# Patient Record
Sex: Male | Born: 1935 | Race: White | Hispanic: No | Marital: Married | Smoking: Former smoker
Health system: Southern US, Community
[De-identification: ages and names within clinical notes are randomized; demographics above are authoritative.]

## PROBLEM LIST (undated history)

## (undated) DIAGNOSIS — E039 Hypothyroidism, unspecified: Secondary | ICD-10-CM

## (undated) DIAGNOSIS — F0390 Unspecified dementia without behavioral disturbance: Secondary | ICD-10-CM

---

## 2020-08-08 ENCOUNTER — Encounter (HOSPITAL_COMMUNITY): Payer: Self-pay

## 2020-08-08 ENCOUNTER — Emergency Department (HOSPITAL_COMMUNITY): Payer: Medicare Other

## 2020-08-08 ENCOUNTER — Emergency Department (HOSPITAL_COMMUNITY)
Admission: EM | Admit: 2020-08-08 | Discharge: 2020-08-08 | Disposition: A | Discharge status: Home / Self Care | Payer: Medicare Other | Attending: Emergency Medicine | Admitting: Emergency Medicine

## 2020-08-08 ENCOUNTER — Other Ambulatory Visit: Payer: Self-pay

## 2020-08-08 DIAGNOSIS — S12101A Unspecified nondisplaced fracture of second cervical vertebra, initial encounter for closed fracture: Secondary | ICD-10-CM | POA: Diagnosis not present

## 2020-08-08 DIAGNOSIS — W01198A Fall on same level from slipping, tripping and stumbling with subsequent striking against other object, initial encounter: Secondary | ICD-10-CM | POA: Diagnosis not present

## 2020-08-08 DIAGNOSIS — S199XXA Unspecified injury of neck, initial encounter: Secondary | ICD-10-CM | POA: Diagnosis present

## 2020-08-08 DIAGNOSIS — Z20822 Contact with and (suspected) exposure to covid-19: Secondary | ICD-10-CM | POA: Insufficient documentation

## 2020-08-08 DIAGNOSIS — Y92129 Unspecified place in nursing home as the place of occurrence of the external cause: Secondary | ICD-10-CM | POA: Diagnosis not present

## 2020-08-08 DIAGNOSIS — F0390 Unspecified dementia without behavioral disturbance: Secondary | ICD-10-CM | POA: Diagnosis not present

## 2020-08-08 DIAGNOSIS — M25511 Pain in right shoulder: Secondary | ICD-10-CM | POA: Insufficient documentation

## 2020-08-08 LAB — CBC WITH DIFFERENTIAL/PLATELET
Abs Immature Granulocytes: 0.09 10*3/uL — ABNORMAL HIGH (ref 0.00–0.07)
Basophils Absolute: 0 10*3/uL (ref 0.0–0.1)
Basophils Relative: 0 %
Eosinophils Absolute: 0 10*3/uL (ref 0.0–0.5)
Eosinophils Relative: 0 %
HCT: 43.3 % (ref 39.0–52.0)
Hemoglobin: 14.2 g/dL (ref 13.0–17.0)
Immature Granulocytes: 1 %
Lymphocytes Relative: 6 %
Lymphs Abs: 0.9 10*3/uL (ref 0.7–4.0)
MCH: 31.1 pg (ref 26.0–34.0)
MCHC: 32.8 g/dL (ref 30.0–36.0)
MCV: 95 fL (ref 80.0–100.0)
Monocytes Absolute: 1.3 10*3/uL — ABNORMAL HIGH (ref 0.1–1.0)
Monocytes Relative: 8 %
Neutro Abs: 13.7 10*3/uL — ABNORMAL HIGH (ref 1.7–7.7)
Neutrophils Relative %: 85 %
Platelets: 295 10*3/uL (ref 150–400)
RBC: 4.56 MIL/uL (ref 4.22–5.81)
RDW: 13 % (ref 11.5–15.5)
WBC: 16 10*3/uL — ABNORMAL HIGH (ref 4.0–10.5)
nRBC: 0 % (ref 0.0–0.2)

## 2020-08-08 LAB — BASIC METABOLIC PANEL
Anion gap: 11 (ref 5–15)
BUN: 19 mg/dL (ref 8–23)
CO2: 25 mmol/L (ref 22–32)
Calcium: 9.6 mg/dL (ref 8.9–10.3)
Chloride: 105 mmol/L (ref 98–111)
Creatinine, Ser: 0.99 mg/dL (ref 0.61–1.24)
GFR, Estimated: >60 mL/min (ref >60)
Glucose, Bld: 102 mg/dL — ABNORMAL HIGH (ref 70–99)
Potassium: 3.8 mmol/L (ref 3.5–5.1)
Sodium: 141 mmol/L (ref 135–145)

## 2020-08-08 LAB — RESP PANEL BY RT-PCR (FLU A&B, COVID) ARPGX2
Influenza A by PCR: NEGATIVE
Influenza B by PCR: NEGATIVE
SARS Coronavirus 2 by RT PCR: NEGATIVE

## 2020-08-08 NOTE — ED Provider Notes (Signed)
[facility]-EMERGENCY DEPT Provider Note   CSN: [id number] Arrival date & time: 08/08/20  1038     History Chief Complaint  Patient presents with   Jason Valdez is a 87 y.o. male presents today from nursing facility following a witnessed fall, mechanical slip falling backwards striking the back of his head on the ground.  Patient reporting posterior headache and neck pain, mild aching constant worsens with palpation improves with rest.  Patient does have a history of dementia.  Facility reports no blood thinner use.  Additionally patient reports right shoulder pain gradually improving since arrival to this facility, aching, constant, worsens with movement.  Cervical collar placed by EMS PTA.  Denies recent illness additional concerns today.  Level 5 caveat dementia. HPI     History reviewed. No pertinent past medical history.  There are no problems to display for this patient.        No family history on file.     Home Medications Prior to Admission medications   Not on File    Allergies    Patient has no allergy information on record.  Review of Systems   Review of Systems  Unable to perform ROS: Dementia   Physical Exam Updated Vital Signs BP (!) 144/79   Pulse 72   Temp 97.9 F (36.6 C) (Oral)   Resp (!) 196   SpO2 100%   Physical Exam Constitutional:      General: He is not in acute distress.    Appearance: Normal appearance. He is well-developed. He is not ill-appearing or diaphoretic.  HENT:     Head: Normocephalic and atraumatic.  Eyes:     General: Vision grossly intact. Gaze aligned appropriately.     Pupils: Pupils are equal, round, and reactive to light.  Neck:     Trachea: Trachea and phonation normal.     Comments: Cervical collar in place.  No anterior neck tenderness to palpation. Cardiovascular:     Pulses:          Dorsalis pedis pulses are 2+ on the right side and 2+ on the left side.   Pulmonary:     Effort: Pulmonary effort is normal. No respiratory distress.  Abdominal:     General: There is no distension.     Palpations: Abdomen is soft.     Tenderness: There is no abdominal tenderness. There is no guarding or rebound.  Musculoskeletal:        General: Normal range of motion.     Comments: No midline T/L spinal tenderness to palpation, no paraspinal muscle tenderness, no deformity, crepitus, or step-off noted. No sign of injury to the neck or back.  Pelvis stable to compression bilaterally without pain.  Full range of motion of the bilateral hips, knees, ankles and feet without pain.  Full range of motion strength with the left upper extremity without pain. - Right shoulder: Diffuse tenderness palpation, no bony tenderness or deformity.  Full range of motion and strength for age with movements of the right shoulder.  No pain with movement at the right elbow hand or wrist.  Capillary refill and sensation intact.  Radial pulses intact.  Compartments are soft.  Feet:     Right foot:     Protective Sensation: 3 sites tested.  3 sites sensed.     Left foot:     Protective Sensation: 3 sites tested.  3 sites sensed.  Skin:    General: Skin is warm  and dry.  Neurological:     Mental Status: He is alert.     GCS: GCS eye subscore is 4. GCS verbal subscore is 5. GCS motor subscore is 6.     Comments: Speech is clear and goal oriented, follows commands Major Cranial nerves without deficit, no facial droop Moves extremities without ataxia, coordination intact 5/5 strength with bilateral grip and bilateral dorsi/plantar flexion  Psychiatric:        Behavior: Behavior normal.    ED Results / Procedures / Treatments   Labs (all labs ordered are listed, but only abnormal results are displayed) Labs Reviewed  CBC WITH DIFFERENTIAL/PLATELET - Abnormal; Notable for the following components:      Result Value   WBC 16.0 (*)    Neutro Abs 13.7 (*)    Monocytes Absolute 1.3  (*)    Abs Immature Granulocytes 0.09 (*)    All other components within normal limits  BASIC METABOLIC PANEL - Abnormal; Notable for the following components:   Glucose, Bld 102 (*)    All other components within normal limits  RESP PANEL BY RT-PCR (FLU A&B, COVID) [id number]    EKG None  Radiology DG Shoulder Right  Result Date: 08/08/2020 CLINICAL DATA:  Pain after a fall. EXAM: RIGHT SHOULDER - 2+ VIEW COMPARISON:  None. FINDINGS: No evidence for an acute fracture. No subluxation or dislocation. No shoulder separation. Degenerative changes noted at the acromioclavicular joint. IMPRESSION: Negative. Electronically Signed   By: Kennith Center M.D.   On: 08/08/2020 12:59   CT Head Wo Contrast  Result Date: 08/08/2020 CLINICAL DATA:  Head trauma EXAM: CT HEAD WITHOUT CONTRAST CT CERVICAL SPINE WITHOUT CONTRAST TECHNIQUE: Multidetector CT imaging of the head and cervical spine was performed following the standard protocol without intravenous contrast. Multiplanar CT image reconstructions of the cervical spine were also generated. COMPARISON:  None FINDINGS: CT HEAD FINDINGS Brain: Generalized atrophy. Normal ventricular morphology. No midline shift or mass effect. Small vessel chronic ischemic changes of deep cerebral white matter. No intracranial hemorrhage, mass lesion, evidence of acute infarction, or extra-axial fluid collection. Vascular: No hyperdense vessels Skull: Demineralized but intact Sinuses/Orbits: Mucosal retention cyst RIGHT maxillary sinus. Remaining paranasal sinuses and mastoid air cells clear Other: N/A CT CERVICAL SPINE FINDINGS Alignment: Minimal anterolisthesis at C4-C5 due to degenerative disc and facet disease. Remaining alignments normal Skull base and vertebrae: Diffuse osseous demineralization. Skull base intact. Type 3 odontoid fracture identified extending into the RIGHT body of C2 as well as intra-articular at the RIGHT C1-C2 articulation. Less extension into LEFT C2 body  but extension of fracture into the LEFT pedicle of C2 is present. Fracture minimally displaced. Scattered facet degenerative changes. Disc space narrowing throughout cervical spine with endplate spurs greatest at C5-C6 and C7-T1. No additional fracture or subluxation. Soft tissues and spinal canal: Prevertebral soft tissues normal thickness. Atherosclerotic calcifications at carotid bifurcations. Disc levels:  No specific abnormalities Upper chest: Lung apices clear. Other: N/A IMPRESSION: Atrophy with small vessel chronic ischemic changes of deep cerebral white matter. No acute intracranial abnormalities. Multilevel degenerative disc and facet disease changes of the cervical spine as above. Minimally displaced type 3 odontoid fracture extending into the RIGHT body of C2 as well as intra-articular at the RIGHT C1-C2 articulation, with less extension into LEFT C2 body but with fracture into into the LEFT pedicle of C2. Critical Value/emergent results were called by telephone at the time of interpretation on 08/08/2020 at 1146 hrs to provider Dr. Bernette Mayers, who verbally  acknowledged these results. Electronically Signed   By: Ulyses SouthwardMark  Boles M.D.   On: 08/08/2020 11:55   CT Cervical Spine Wo Contrast  Result Date: 08/08/2020 CLINICAL DATA:  Head trauma EXAM: CT HEAD WITHOUT CONTRAST CT CERVICAL SPINE WITHOUT CONTRAST TECHNIQUE: Multidetector CT imaging of the head and cervical spine was performed following the standard protocol without intravenous contrast. Multiplanar CT image reconstructions of the cervical spine were also generated. COMPARISON:  None FINDINGS: CT HEAD FINDINGS Brain: Generalized atrophy. Normal ventricular morphology. No midline shift or mass effect. Small vessel chronic ischemic changes of deep cerebral white matter. No intracranial hemorrhage, mass lesion, evidence of acute infarction, or extra-axial fluid collection. Vascular: No hyperdense vessels Skull: Demineralized but intact Sinuses/Orbits:  Mucosal retention cyst RIGHT maxillary sinus. Remaining paranasal sinuses and mastoid air cells clear Other: N/A CT CERVICAL SPINE FINDINGS Alignment: Minimal anterolisthesis at C4-C5 due to degenerative disc and facet disease. Remaining alignments normal Skull base and vertebrae: Diffuse osseous demineralization. Skull base intact. Type 3 odontoid fracture identified extending into the RIGHT body of C2 as well as intra-articular at the RIGHT C1-C2 articulation. Less extension into LEFT C2 body but extension of fracture into the LEFT pedicle of C2 is present. Fracture minimally displaced. Scattered facet degenerative changes. Disc space narrowing throughout cervical spine with endplate spurs greatest at C5-C6 and C7-T1. No additional fracture or subluxation. Soft tissues and spinal canal: Prevertebral soft tissues normal thickness. Atherosclerotic calcifications at carotid bifurcations. Disc levels:  No specific abnormalities Upper chest: Lung apices clear. Other: N/A IMPRESSION: Atrophy with small vessel chronic ischemic changes of deep cerebral white matter. No acute intracranial abnormalities. Multilevel degenerative disc and facet disease changes of the cervical spine as above. Minimally displaced type 3 odontoid fracture extending into the RIGHT body of C2 as well as intra-articular at the RIGHT C1-C2 articulation, with less extension into LEFT C2 body but with fracture into into the LEFT pedicle of C2. Critical Value/emergent results were called by telephone at the time of interpretation on 08/08/2020 at 1146 hrs to provider Dr. Bernette MayersSheldon, who verbally acknowledged these results. Electronically Signed   By: Ulyses SouthwardMark  Boles M.D.   On: 08/08/2020 11:55   DG Pelvis Portable  Result Date: 08/08/2020 CLINICAL DATA:  Fall EXAM: PORTABLE PELVIS 1-2 VIEWS COMPARISON:  None. FINDINGS: Mild symmetric degenerative changes in the hips and in the lower lumbar spine. SI joints are symmetric. No acute bony abnormality.  Specifically, no fracture, subluxation, or dislocation. IMPRESSION: No acute bony abnormality. Electronically Signed   By: Charlett NoseKevin  Dover M.D.   On: 08/08/2020 11:58   DG Chest Portable 1 View  Result Date: 08/08/2020 CLINICAL DATA:  Witnessed fall, altered mental status EXAM: PORTABLE CHEST 1 VIEW COMPARISON:  08/08/2020 FINDINGS: The heart size and mediastinal contours are within normal limits. Both lungs are clear. The visualized skeletal structures are unremarkable. IMPRESSION: No active disease. Electronically Signed   By: Charlett NoseKevin  Dover M.D.   On: 08/08/2020 11:59    Procedures Procedures   Medications Ordered in ED Medications - No data to display  ED Course  I have reviewed the triage vital signs and the nursing notes.  Pertinent labs & imaging results that were available during my care of the patient were reviewed by me and considered in my medical decision making (see chart for details).     MDM Rules/Calculators/A&P                         Additional  history obtained from: Nursing notes from this visit. Family, patient's son at bedside. No prior medical records to review. -------------------------------------------- I ordered, reviewed and interpreted labs which include: COVID/influenza panel negative. CBC shows leukocytosis of 16.0, suspect this is secondary to fall and injury today, low suspicion for infection at this time, hemoglobin within normal limits, platelet count within normal limits. BMP shows no emergent electrolyte derangement, AKI or gap.  CT head/Cspine:  IMPRESSION:  Atrophy with small vessel chronic ischemic changes of deep cerebral  white matter.     No acute intracranial abnormalities.     Multilevel degenerative disc and facet disease changes of the  cervical spine as above.     Minimally displaced type 3 odontoid fracture extending into the  RIGHT body of C2 as well as intra-articular at the RIGHT C1-C2  articulation, with less extension into  LEFT C2 body but with  fracture into into the LEFT pedicle of C2.     Critical Value/emergent results were called by telephone at the time  of interpretation on 08/08/2020 at 1146 hrs to provider Dr. Bernette Mayers,  who verbally acknowledged these results.   DG Chest:  IMPRESSION:  No active disease.   DG Pelvis:  IMPRESSION:  No acute bony abnormality.   DG Right Shoulder:  IMPRESSION:  Negative.  - 12:16 PM: Consult with neurosurgery, Dr. Lovell Sheehan.  Dr. Lovell Sheehan recommends placing patient in Aspen collar and have him follow-up in their office in 2 weeks. - Patient reassessed resting comfortably no acute distress reports he is feeling well.  No weakness of the extremities.  No neurologic complaints.  Patient was placed in an Aspen cervical collar, patient and his son at bedside were educated on keeping the collar in place to avoid further injury.  Patient was also given a sling for his right shoulder.  Suspect muscular shoulder pain due to fall, no evidence for fracture dislocation at this time additionally patient is neurovascular intact distally.  Referral given to on-call orthopedist, Delbert Harness for follow-up regarding right shoulder pain.  No additional work-up is indicated this time, patient has no pain to the chest, abdomen, pelvis, back, lower extremities or left upper extremity.  I discussed in detail plan of care with the patient's son and the patient and they both state understanding.  At this time there does not appear to be any evidence of an acute emergency medical condition and the patient appears stable for discharge with appropriate outpatient follow up. Diagnosis was discussed with patient who verbalizes understanding of care plan and is agreeable to discharge. I have discussed return precautions with patient and son who verbalizes understanding. Patient encouraged to follow-up with their PCP, orthopedist and neurosurgery.  All questions answered.  Patient's case discussed with  Dr. Bernette Mayers who agrees with plan to discharge with follow-up.   Note: Portions of this report may have been transcribed using voice recognition software. Every effort was made to ensure accuracy; however, inadvertent computerized transcription errors may still be present.  Final Clinical Impression(s) / ED Diagnoses Final diagnoses:  Closed nondisplaced fracture of second cervical vertebra, unspecified fracture morphology, initial encounter [place] [facility])  Acute pain of right shoulder    Rx / DC Orders ED Discharge Orders     None        Elizabeth Palau 08/08/20 1550    Pollyann Savoy, MD 08/09/20 [phone]

## 2020-08-08 NOTE — Progress Notes (Signed)
Orthopedic Tech Progress Note Patient Details:  Jason Valdez [birth year] [id number]  Ortho Devices Type of Ortho Device: Shoulder immobilizer   Post Interventions Patient Tolerated: Well Instructions Provided: Adjustment of device, Care of device  Saul Fordyce 08/08/2020, 3:01 PM

## 2020-08-08 NOTE — Discharge Instructions (Signed)
At this time there does not appear to be the presence of an emergent medical condition, however there is always the potential for conditions to change. Please read and follow the below instructions.  Please return to the Emergency Department immediately for any new or worsening symptoms. Please be sure to follow up with your Primary Care Provider within one week regarding your visit today; please call their office to schedule an appointment even if you are feeling better for a follow-up visit. Please keep your cervical collar on at all times to protect the bones of your neck.  Please call [facility] today to schedule a follow-up appointment, Dr. Lovell Sheehan would like to see you in 2 weeks in his office for further treatment. You may use the sling to inject your right shoulder from further injury.  You may call the orthopedic specialist at [facility] for a follow-up regarding your right shoulder pain after your fall.  Go to the nearest Emergency Department immediately if: You have fever or chills You have neck pain that gets worse. You develop difficulties swallowing or breathing. You develop swelling in your neck. Your arm, hand, or fingers: Tingle. Are numb. Are swollen. Are painful. Turn white or blue. You have any of the following problems in your arms or legs or both: Numbness. Weakness. Burning pain. Movement problems. You are unable to control when you urinate or have a bowel movement (incontinence). You have problems with coordination or difficulty walking. You have any new/concerning or worsening of symptoms  Please read the additional information packets attached to your discharge summary.  Do not take your medicine if  develop an itchy rash, swelling in your mouth or lips, or difficulty breathing; call 911 and seek immediate emergency medical attention if this occurs.  You may review your lab tests and imaging results in their entirety on your  MyChart account.  Please discuss all results of fully with your primary care provider and other specialist at your follow-up visit.  Note: Portions of this text may have been transcribed using voice recognition software. Every effort was made to ensure accuracy; however, inadvertent computerized transcription errors may still be present.

## 2020-08-08 NOTE — Consult Note (Signed)
I was contacted by Apolinar Junes, PA-C regarding this patient.  By report he is a demented 87 year old who took a fall.  He was brought into [facility] via EMS.  He is CT scan of the head was unremarkable except for atrophy.  His cervical CT demonstrated a cervical fracture.  There is no reported weakness.  I reviewed the patient's cervical CT performed at [facility] today.  He has a type III odontoid fracture which extends into the right C1-2 facet and pedicle.  The fracture is fairly well aligned.  This fracture will likely heal adequately in the collar.  I have recommended the patient be placed in a hard cervical collar and follow-up with me in the office in a week or 2.

## 2020-08-08 NOTE — ED Triage Notes (Signed)
EMS reports from [place] at [place], witnessed mechanical slip and fall, strike to back of head. No obvious injuries, no LOC or blood thinners. Pt c/o head and neck pain. Pt hx of Dementia, facility states at baseline.  BP 180/88 HR 64 RR 16 Sp02 100 RA CBG 108

## 2020-08-08 NOTE — Progress Notes (Signed)
Orthopedic Tech Progress Note Patient Details:  Jason Valdez January 18, 1929 [id number]  Patient ID: Jason Valdez, male   DOB: May 13, 1928, 87 y.o.   MRN: [id number]  Jason Valdez 08/08/2020, 12:31 PMCalled and routed Aspen collar to [facility]

## 2020-08-08 NOTE — ED Notes (Signed)
Ortho informed of Aspen collar order and will acquire and apply.Marland Kitchen

## 2020-08-08 NOTE — ED Notes (Signed)
Pt is able to walk and he can walked well.

## 2020-10-13 ENCOUNTER — Emergency Department (HOSPITAL_COMMUNITY): Payer: Medicare Other

## 2020-10-13 ENCOUNTER — Encounter (HOSPITAL_COMMUNITY): Payer: Self-pay | Admitting: Emergency Medicine

## 2020-10-13 ENCOUNTER — Other Ambulatory Visit: Payer: Self-pay

## 2020-10-13 ENCOUNTER — Emergency Department (HOSPITAL_COMMUNITY)
Admission: EM | Admit: 2020-10-13 | Discharge: 2020-10-14 | Disposition: A | Discharge status: Home / Self Care | Payer: Medicare Other | Attending: Emergency Medicine | Admitting: Emergency Medicine

## 2020-10-13 DIAGNOSIS — R059 Cough, unspecified: Secondary | ICD-10-CM | POA: Diagnosis present

## 2020-10-13 DIAGNOSIS — U071 COVID-19: Secondary | ICD-10-CM | POA: Diagnosis not present

## 2020-10-13 DIAGNOSIS — D72829 Elevated white blood cell count, unspecified: Secondary | ICD-10-CM | POA: Diagnosis not present

## 2020-10-13 DIAGNOSIS — F0390 Unspecified dementia without behavioral disturbance: Secondary | ICD-10-CM | POA: Diagnosis not present

## 2020-10-13 DIAGNOSIS — Z87891 Personal history of nicotine dependence: Secondary | ICD-10-CM | POA: Diagnosis not present

## 2020-10-13 LAB — COMPREHENSIVE METABOLIC PANEL
ALT: 9 U/L (ref 0–44)
AST: 16 U/L (ref 15–41)
Albumin: 4.3 g/dL (ref 3.5–5.0)
Alkaline Phosphatase: 64 U/L (ref 38–126)
Anion gap: 8 (ref 5–15)
BUN: 24 mg/dL — ABNORMAL HIGH (ref 8–23)
CO2: 27 mmol/L (ref 22–32)
Calcium: 9.1 mg/dL (ref 8.9–10.3)
Chloride: 103 mmol/L (ref 98–111)
Creatinine, Ser: 1.13 mg/dL (ref 0.61–1.24)
GFR, Estimated: >60 mL/min (ref >60)
Glucose, Bld: 103 mg/dL — ABNORMAL HIGH (ref 70–99)
Potassium: 4.1 mmol/L (ref 3.5–5.1)
Sodium: 138 mmol/L (ref 135–145)
Total Bilirubin: 0.6 mg/dL (ref 0.3–1.2)
Total Protein: 7.5 g/dL (ref 6.5–8.1)

## 2020-10-13 LAB — CBC WITH DIFFERENTIAL/PLATELET
Abs Immature Granulocytes: 0.03 10*3/uL (ref 0.00–0.07)
Basophils Absolute: 0.1 10*3/uL (ref 0.0–0.1)
Basophils Relative: 1 %
Eosinophils Absolute: 0.2 10*3/uL (ref 0.0–0.5)
Eosinophils Relative: 1 %
HCT: 43 % (ref 39.0–52.0)
Hemoglobin: 13.7 g/dL (ref 13.0–17.0)
Immature Granulocytes: 0 %
Lymphocytes Relative: 12 %
Lymphs Abs: 1.2 10*3/uL (ref 0.7–4.0)
MCH: 31.5 pg (ref 26.0–34.0)
MCHC: 31.9 g/dL (ref 30.0–36.0)
MCV: 98.9 fL (ref 80.0–100.0)
Monocytes Absolute: 1.6 10*3/uL — ABNORMAL HIGH (ref 0.1–1.0)
Monocytes Relative: 15 %
Neutro Abs: 7.7 10*3/uL (ref 1.7–7.7)
Neutrophils Relative %: 71 %
Platelets: 277 10*3/uL (ref 150–400)
RBC: 4.35 MIL/uL (ref 4.22–5.81)
RDW: 13.2 % (ref 11.5–15.5)
WBC: 10.7 10*3/uL — ABNORMAL HIGH (ref 4.0–10.5)
nRBC: 0 % (ref 0.0–0.2)

## 2020-10-13 MED ORDER — BENZONATATE 100 MG PO CAPS: 100.0000 mg | ORAL_CAPSULE | Freq: Three times a day (TID) | ORAL | 0 refills | Status: DC

## 2020-10-13 MED ORDER — MOLNUPIRAVIR EUA 200MG CAPSULE: 4.0000 | ORAL_CAPSULE | Freq: Two times a day (BID) | ORAL | 0 refills | Status: AC

## 2020-10-13 NOTE — ED Notes (Signed)
Per [facility] at [facility] should bring him down to the side door because they cannot let him in through the front door.

## 2020-10-13 NOTE — ED Provider Notes (Signed)
[facility]-EMERGENCY DEPT Provider Note   CSN: [id number] Arrival date & time: 10/13/20  1915    History Cough  Jason Valdez is a 87 y.o. male with past medical history significant for dementia who presents for evaluation of cough, COVID-positive.  Diagnosed yesterday.  Symptom onset day prior to that>>2 days PTA.  States his cough is nonproductive.  Patient has no complaints, denies headache, emesis, chest pain, shortness of breath, abdominal pain, weakness.  Does state occasionally he gets a scratchy throat however has been able to eat and drink without difficulty.   [name] at [phone] updated on plan. Apparently Dx with COVID yesterday. Sx onset day before. Apparently facility concerned with patient cough.  Spoke with [name] at [facility]. Patient with cough, congestion. States patient wanted meds for sx however no doc available at facility currently. At baseline mentation, confused. No recent falls.   Level 5 caveat-dementia  HPI     History reviewed. No pertinent past medical history.  There are no problems to display for this patient.   History reviewed. No pertinent surgical history.    No family history on file.  Social History   Tobacco Use   Smoking status: Former    Types: Cigarettes   Smokeless tobacco: Never    Home Medications Prior to Admission medications   Medication Sig Start Date End Date Taking? Authorizing Provider  benzonatate (TESSALON) 100 MG capsule Take 1 capsule (100 mg total) by mouth every 8 (eight) hours. 10/13/20  Yes Donnette Macmullen A, PA-C  molnupiravir EUA (LAGEVRIO) 200 mg CAPS capsule Take 4 capsules (800 mg total) by mouth 2 (two) times daily for 5 days. 10/13/20 10/18/20 Yes Donnavan Covault A, PA-C    Allergies    Patient has no allergy information on record.  Review of Systems   Review of Systems  Unable to perform ROS: Dementia  Constitutional: Negative.   HENT: Negative.     Respiratory:  Positive for cough. Negative for apnea, choking, chest tightness, shortness of breath, wheezing and stridor.   Cardiovascular: Negative.   Gastrointestinal: Negative.   Genitourinary: Negative.   Musculoskeletal: Negative.   Skin: Negative.   Neurological: Negative.   All other systems reviewed and are negative.  Physical Exam Updated Vital Signs BP 136/77   Pulse (!) 58   Temp 98 F (36.7 C) (Oral)   Resp 20   Ht 6\' 1"  (1.854 m)   Wt 81.6 kg   SpO2 98%   BMI 23.75 kg/m   Physical Exam Vitals and nursing note reviewed.  Constitutional:      General: He is not in acute distress.    Appearance: He is well-developed. He is not ill-appearing, toxic-appearing or diaphoretic.  HENT:     Head: Normocephalic and atraumatic.     Nose: Nose normal.     Mouth/Throat:     Mouth: Mucous membranes are moist.     Comments: Posterior oropharynx clear.  Uvula midline.  No evidence of PTA or RPA.  No pooling of secretions Eyes:     Pupils: Pupils are equal, round, and reactive to light.  Cardiovascular:     Rate and Rhythm: Normal rate and regular rhythm.     Pulses: Normal pulses.          Radial pulses are 2+ on the right side and 2+ on the left side.       Dorsalis pedis pulses are 2+ on the right side and 2+ on the left  side.     Heart sounds: Normal heart sounds.  Pulmonary:     Effort: Pulmonary effort is normal. No respiratory distress.     Breath sounds: Normal breath sounds.     Comments: Clear bilaterally, speaks in full sentences without difficulty Abdominal:     General: Bowel sounds are normal. There is no distension.     Palpations: Abdomen is soft.     Tenderness: There is no abdominal tenderness. There is no right CVA tenderness, left CVA tenderness, guarding or rebound.  Musculoskeletal:        General: Normal range of motion.     Cervical back: Normal range of motion and neck supple.     Comments: No bony tenderness.  Moves all 4 extremities.   Compartments soft  Skin:    General: Skin is warm and dry.     Capillary Refill: Capillary refill takes less than 2 seconds.     Comments: No rash  Neurological:     General: No focal deficit present.     Mental Status: He is alert. Mental status is at baseline.     Comments: Aler to person, DOB, not time Follow commands CN 2-12 grossly intact Ambulatory without ataxia Equal grip Intact sensation    ED Results / Procedures / Treatments   Labs (all labs ordered are listed, but only abnormal results are displayed) Labs Reviewed  CBC WITH DIFFERENTIAL/PLATELET - Abnormal; Notable for the following components:      Result Value   WBC 10.7 (*)    Monocytes Absolute 1.6 (*)    All other components within normal limits  COMPREHENSIVE METABOLIC PANEL - Abnormal; Notable for the following components:   Glucose, Bld 103 (*)    BUN 24 (*)    All other components within normal limits    EKG EKG Interpretation  Date/Time:  Thursday October 13 2020 20:32:02 EDT Ventricular Rate:  61 PR Interval:  180 QRS Duration: 111 QT Interval:  459 QTC Calculation: 463 R Axis:   3 Text Interpretation: Ectopic atrial rhythm Atrial premature complexes Left atrial enlargement Probable inferior infarct, old Confirmed by Gloris Manchester [phone]) on 10/13/2020 8:44:28 PM  Radiology DG Chest Portable 1 View  Result Date: 10/13/2020 CLINICAL DATA:  Cough, EXAM: PORTABLE CHEST 1 VIEW COMPARISON:  Chest x-ray 08/08/2020 FINDINGS: The heart and mediastinal contours are unchanged. Aortic calcification. No focal consolidation. No pulmonary edema. No pleural effusion. No pneumothorax. No acute osseous abnormality. IMPRESSION: No acute cardiopulmonary abnormality Electronically Signed   By: Tish Frederickson M.D.   On: 10/13/2020 20:19    Procedures Procedures   Medications Ordered in ED Medications - No data to display  ED Course  I have reviewed the triage vital signs and the nursing notes.  Pertinent labs  & imaging results that were available during my care of the patient were reviewed by me and considered in my medical decision making (see chart for details).  87 year old recently diagnosed with COVID presents for evaluation of cough.  He is afebrile, nonseptic, not ill-appearing.  His heart and lungs are clear.  Abdomen soft, nontender.  Normal musculoskeletal exam, nonfocal neuro exam without deficits.  Has a known history of dementia however patient appears pleasant, no complaints.  Posterior oropharynx clear, no evidence of PTA or RPA, no pooling of secretions.  Plan check labs, chest x-ray reassess  Labs and imaging personally reviewed and interpreted:  Chest x-ray without any evidence of infiltrates, cardiomegaly, pulmonary edema, pneumothorax CBC with mild leukocytosis  at 10.7, hemoglobin 13.7 EKG without ischemic changes CMP without acute abnormality  Patient reassessed. Discussed plan with family via phone. Will dc home with sx management for his COVID. Ambulatory here without hypoxia.  The patient has been appropriately medically screened and/or stabilized in the ED. I have low suspicion for any other emergent medical condition which would require further screening, evaluation or treatment in the ED or require inpatient management.  Patient is hemodynamically stable and in no acute distress.  Patient able to ambulate in department prior to ED.  Evaluation does not show acute pathology that would require ongoing or additional emergent interventions while in the emergency department or further inpatient treatment.  I have discussed the diagnosis with the patient and answered all questions.  Pain is been managed while in the emergency department and patient has no further complaints prior to discharge.  Patient is comfortable with plan discussed in room and is stable for discharge at this time.  I have discussed strict return precautions for returning to the emergency department.  Patient was  encouraged to follow-up with PCP/specialist refer to at discharge.     MDM Rules/Calculators/A&P                          Lattie Cervi was evaluated in Emergency Department on 10/13/2020 for the symptoms described in the history of present illness. He was evaluated in the context of the global COVID-19 pandemic, which necessitated consideration that the patient might be at risk for infection with the SARS-CoV-2 virus that causes COVID-19. Institutional protocols and algorithms that pertain to the evaluation of patients at risk for COVID-19 are in a state of rapid change based on information released by regulatory bodies including the CDC and federal and state organizations. These policies and algorithms were followed during the patient's care in the ED.  Final Clinical Impression(s) / ED Diagnoses Final diagnoses:  COVID    Rx / DC Orders ED Discharge Orders          Ordered    molnupiravir EUA (LAGEVRIO) 200 mg CAPS capsule  2 times daily        10/13/20 2207    benzonatate (TESSALON) 100 MG capsule  Every 8 hours        10/13/20 2207             Hermenia Fritcher A, PA-C 10/13/20 2329    Gloris Manchester, MD 10/14/20 1529

## 2020-10-13 NOTE — Discharge Instructions (Signed)
Take the medications as prescribed  Return for new or worsening symptoms 

## 2020-10-13 NOTE — ED Notes (Signed)
Pt given crackers and orange juice for PO challenge. Pt able to eat and drink with no distress

## 2020-10-13 NOTE — ED Triage Notes (Signed)
Pt arrived via EMS from [place] [facility] [facility]. Pt is covid positive and has had a cough and a sore throat. Pt was diagnosed with covid yesterday. Pt has hx of dementia. Pt has hx of sinus arrhythmia.

## 2020-10-13 NOTE — ED Notes (Signed)
Patients son called for an update : Jason Valdez [phone]

## 2020-11-03 ENCOUNTER — Non-Acute Institutional Stay: Payer: Medicare Other | Admitting: *Deleted

## 2020-11-03 ENCOUNTER — Other Ambulatory Visit: Payer: Self-pay

## 2020-11-03 ENCOUNTER — Non-Acute Institutional Stay: Payer: Medicare Other

## 2020-11-03 DIAGNOSIS — Z515 Encounter for palliative care: Secondary | ICD-10-CM

## 2020-11-09 ENCOUNTER — Other Ambulatory Visit: Payer: Self-pay | Admitting: Orthopedic Surgery

## 2020-11-09 DIAGNOSIS — S129XXA Fracture of neck, unspecified, initial encounter: Secondary | ICD-10-CM

## 2020-11-09 NOTE — Progress Notes (Signed)
COMMUNITY PALLIATIVE CARE SW NOTE  PATIENT NAME: Jason Valdez DOB: 1928-03-17 MRN: [id number]  PRIMARY CARE PROVIDER: System, Provider Not In  RESPONSIBLE PARTY:  Acct ID - Guarantor Home Phone Work Phone Relationship Acct Type  [id number] - Dilday,A* [phone]  Self P/F     [address], [place], [place] [address]     PLAN OF CARE and INTERVENTIONS:             GOALS OF CARE/ ADVANCE CARE PLANNING:  Goal is for patient to remain in the facility with his wife. Patient is a DNR. SOCIAL/EMOTIONAL/SPIRITUAL ASSESSMENT/ INTERVENTIONS:  SW obtained verbal consents to SW and RN-M. Dimas Aguas completed an initial visit with patient at the facility. Patient was present in the dayroom, watching TV with his wife. He was awake and alert. He appeared to be alert and oriented to self and situation. He was calm and cordial with the team. He denied that he was having any pain. Patient is thin in appearance, but he ambulates independently. Patient engaged briefly in life review, but stopped to assist his wife back to their room. SW consulted with the med tech-Lal regarding patient status. She report that patient is independent for all ADL's. His appetite is good. Patient's current weight is 156 lbs. He is very attentive to his wife. Patient is continent of bowel and bladder. Patient has mild cognitive deficits, but is able to engage. SW provided assessment of patient needs and comfort, supportive presence, and consult with staff.  PATIENT/CAREGIVER EDUCATION/ COPING:  Patient appears to be coping well.  PERSONAL EMERGENCY PLAN:  Per facility protocol.  COMMUNITY RESOURCES COORDINATION/ HEALTH CARE NAVIGATION:  None.  FINANCIAL/LEGAL CONCERNS/INTERVENTIONS:  None      SOCIAL HX:  Social History   Tobacco Use   Smoking status: Former    Types: Cigarettes   Smokeless tobacco: Never  Substance Use Topics   Alcohol use: Not on file    CODE STATUS: DNR. ADVANCED DIRECTIVES: No MOST  FORM COMPLETE:  No HOSPICE EDUCATION PROVIDED: No  PPS:  Patient is alert and oriented to self. He ambulates independently. His appetite is good.   Duration of visit and documentation: 45 minutes.  [address] [place], [place]

## 2021-01-02 NOTE — Progress Notes (Signed)
[facility] RN NOTE  PATIENT NAME: Jason Valdez DOB: Jan 20, 1929 MRN: [id number]  PRIMARY CARE PROVIDER: System, Provider Not In  RESPONSIBLE PARTY:  Acct ID - Guarantor Home Phone Work Phone Relationship Acct Type  [id number] - Petkus,A* [phone]  Self P/F     [address], [place], [place] [address]   Covid-19 Pre-screening Negative  PLAN OF CARE and INTERVENTION:  ADVANCE CARE PLANNING/GOALS OF CARE: Goal is for patient to remain on the memory care unit at [facility] facility. PATIENT/CAREGIVER EDUCATION: Symptom management DISEASE STATUS: Joint initial visit made with LCSW, M. Lonon. Upon arrival patient and his wife are sitting in the living room watching television on the unit. He is sitting on the couch and his wife in the wheelchair in front of him. He is able to answer questions and make his needs known. He does have some cognitive issues. His mood is pleasant and he laughed and smiled at the comedy playing on the tv. His wife wanted to be taken to her room, so he ended the conversation abruptly to assist her. He is ambulatory without the use of assistive devices. He is very attentive to his wife and her needs. Spoke with med tech, Harvie Junior. She advised that patient is has a good appetite and currently weighs 156 lbs. No dysphagia. He is continent of both bowel and bladder. He was sent to the ED on 10/13/20 due to a persistent cough. He was diagnosed with Covid-19 the previous day. His chest x-ray was negative and was hemodynamically stable. He was treated and released back to the facility. No coughing noted during visit today. Consent given prior to visit for palliative care.   HISTORY OF PRESENT ILLNESS:  This is a 87 yo patient with a diagnosis of dementia. Palliative care team was asked to follow patient for additional support, goals of care and complex decision making.    CODE STATUS: DNR ADVANCED DIRECTIVES: No MOST FORM: no PPS:  50%   (Duration of visit and documentation 30 minutes)   Candiss Norse, RN BSN

## 2021-01-17 ENCOUNTER — Other Ambulatory Visit: Payer: Self-pay

## 2021-01-17 ENCOUNTER — Non-Acute Institutional Stay: Payer: Medicare Other | Admitting: *Deleted

## 2021-01-17 ENCOUNTER — Non-Acute Institutional Stay: Payer: Medicare Other

## 2021-01-17 DIAGNOSIS — Z515 Encounter for palliative care: Secondary | ICD-10-CM

## 2021-01-17 NOTE — Progress Notes (Signed)
[facility] PALL[facility] C[facility] RN NOTE  PATIENT NAME: Jason Valdez DOB: 1928/02/04 MRN: [id number]  PRIMARY CARE PROVIDER: System, Provider Not In  RESPONSIBLE PARTY:  Acct ID - Guarantor Home Phone Work Phone Relationship Acct Type  [id number] - Tremaine,A* [phone]  Self P/F     [place], [place],  [address]   Covid-19 Pre-screening Negative  PLAN OF CARE and INTERVENTION:  ADVANCE CARE PLANNING/GOALS OF CARE: Goal is for patient to remain at current facility, [facility] memory care unit. PATIENT/CAREGIVER EDUCATION: Symptom management DISEASE STATUS: Joint visit made with LCSW, M. Lonon. Met with patient in the living room area at the facility. He was initially having a discussion with another resident, then started watching television with his wife. He remains able to answer simple questions and make his needs known, but does have some confusion. He is very attentive to his wife, who also resides on this unit, and pushes her around in her wheelchair. No physical indicators of pain noted. Breathing even and unlabored. Spoke with facility med-tech, Laveda Norman. She states that patient's condition has been the same. She has not noticed any changes. He remains ambulatory without the use of assistive devices. His appetite is good. His weights have been stable. 10/22 161.4 lbs, 11/22 164.4 lbs, 12/22 163.6 lbs. He remains continent of both bowel and bladder. No issues or concerns reported at this time. Will continue to monitor.  HISTORY OF PRESENT ILLNESS: This is a 87 yo male with a diagnosis of dementia. Palliative care team continues to follow patient for additional support, goals of care and complex decision making.  CODE STATUS: DNR ADVANCED DIRECTIVES: N MOST FORM: no PPS: 50%    (Duration of visit and documentation 30 minutes)   Daryl Eastern, RN BSN

## 2021-01-18 NOTE — Progress Notes (Signed)
COMMUNITY PALLIATIVE CARE SW NOTE  PATIENT NAME: Jason Valdez DOB: 11/26/1928 MRN: [id number]  PRIMARY CARE PROVIDER: System, Provider Not In  RESPONSIBLE PARTY:  Acct ID - Guarantor Home Phone Work Phone Relationship Acct Type  [id number] - Jason Valdez,A* [phone]  Self P/F     [address], [place], [place] [address]     PLAN OF CARE and INTERVENTIONS:             GOALS OF CARE/ ADVANCE CARE PLANNING:  Goal is for patient to remain in the facility with his wife.  SOCIAL/EMOTIONAL/SPIRITUAL ASSESSMENT/ INTERVENTIONS:  SW and RN- M. Dimas Aguas completed a joint visit with patient at the facility.  Patient was present in the living room area with his wife  at the facility. Patient was present with other peers and his wife. Patient is alert and oriented to self and place and  remains able to answer simple questions and make his needs known. Patient is pleasantly confused.  He remains very content and attentive to his wife and pushes her around in her wheelchair. Patient remain independent for ADL's. His appetite remains good and his weight remains stable.He remains pleasantly confused. The med tech-Lal and facility nurse-Kesha was consulted and no concerns were noted.  PATIENT/CAREGIVER EDUCATION/ COPING:  Patient is content and appears to be coping well.  PERSONAL EMERGENCY PLAN:  Per facility protocol. COMMUNITY RESOURCES COORDINATION/ HEALTH CARE NAVIGATION:  None FINANCIAL/LEGAL CONCERNS/INTERVENTIONS:  None     SOCIAL HX:  Social History   Tobacco Use   Smoking status: Former    Types: Cigarettes   Smokeless tobacco: Never  Substance Use Topics   Alcohol use: Not on file    CODE STATUS: DNR ADVANCED DIRECTIVES: No MOST FORM COMPLETE:  No HOSPICE EDUCATION PROVIDED: No  PPS: Patient is alert and oriented to self. He ambulates independently. His appetite is good.    Duration of visit and documentation: 45 minutes.   [address] [place], [place]

## 2021-01-22 ENCOUNTER — Emergency Department (HOSPITAL_BASED_OUTPATIENT_CLINIC_OR_DEPARTMENT_OTHER): Payer: Medicare Other

## 2021-01-22 ENCOUNTER — Other Ambulatory Visit: Payer: Self-pay

## 2021-01-22 ENCOUNTER — Encounter (HOSPITAL_BASED_OUTPATIENT_CLINIC_OR_DEPARTMENT_OTHER): Payer: Self-pay | Admitting: Emergency Medicine

## 2021-01-22 ENCOUNTER — Emergency Department (HOSPITAL_BASED_OUTPATIENT_CLINIC_OR_DEPARTMENT_OTHER): Payer: Medicare Other | Admitting: Radiology

## 2021-01-22 ENCOUNTER — Emergency Department (HOSPITAL_BASED_OUTPATIENT_CLINIC_OR_DEPARTMENT_OTHER)
Admission: EM | Admit: 2021-01-22 | Discharge: 2021-01-22 | Disposition: A | Discharge status: Home / Self Care | Payer: Medicare Other | Attending: Emergency Medicine | Admitting: Emergency Medicine

## 2021-01-22 DIAGNOSIS — W010XXA Fall on same level from slipping, tripping and stumbling without subsequent striking against object, initial encounter: Secondary | ICD-10-CM | POA: Diagnosis not present

## 2021-01-22 DIAGNOSIS — F0390 Unspecified dementia without behavioral disturbance: Secondary | ICD-10-CM | POA: Diagnosis not present

## 2021-01-22 DIAGNOSIS — Z23 Encounter for immunization: Secondary | ICD-10-CM | POA: Insufficient documentation

## 2021-01-22 DIAGNOSIS — E039 Hypothyroidism, unspecified: Secondary | ICD-10-CM | POA: Diagnosis not present

## 2021-01-22 DIAGNOSIS — Z87891 Personal history of nicotine dependence: Secondary | ICD-10-CM | POA: Insufficient documentation

## 2021-01-22 DIAGNOSIS — Z20822 Contact with and (suspected) exposure to covid-19: Secondary | ICD-10-CM | POA: Insufficient documentation

## 2021-01-22 DIAGNOSIS — S0081XA Abrasion of other part of head, initial encounter: Secondary | ICD-10-CM | POA: Insufficient documentation

## 2021-01-22 DIAGNOSIS — S0990XA Unspecified injury of head, initial encounter: Secondary | ICD-10-CM | POA: Diagnosis present

## 2021-01-22 DIAGNOSIS — R0789 Other chest pain: Secondary | ICD-10-CM | POA: Insufficient documentation

## 2021-01-22 DIAGNOSIS — W19XXXA Unspecified fall, initial encounter: Secondary | ICD-10-CM

## 2021-01-22 LAB — CBC
HCT: 41.4 % (ref 39.0–52.0)
Hemoglobin: 13.5 g/dL (ref 13.0–17.0)
MCH: 31 pg (ref 26.0–34.0)
MCHC: 32.6 g/dL (ref 30.0–36.0)
MCV: 95.2 fL (ref 80.0–100.0)
Platelets: 236 10*3/uL (ref 150–400)
RBC: 4.35 MIL/uL (ref 4.22–5.81)
RDW: 13.3 % (ref 11.5–15.5)
WBC: 9.7 10*3/uL (ref 4.0–10.5)
nRBC: 0 % (ref 0.0–0.2)

## 2021-01-22 LAB — COMPREHENSIVE METABOLIC PANEL
ALT: 5 U/L (ref 0–44)
AST: 16 U/L (ref 15–41)
Albumin: 3.9 g/dL (ref 3.5–5.0)
Alkaline Phosphatase: 50 U/L (ref 38–126)
Anion gap: 8 (ref 5–15)
BUN: 21 mg/dL (ref 8–23)
CO2: 28 mmol/L (ref 22–32)
Calcium: 9.1 mg/dL (ref 8.9–10.3)
Chloride: 102 mmol/L (ref 98–111)
Creatinine, Ser: 0.93 mg/dL (ref 0.61–1.24)
GFR, Estimated: >60 mL/min (ref >60)
Glucose, Bld: 97 mg/dL (ref 70–99)
Potassium: 4.3 mmol/L (ref 3.5–5.1)
Sodium: 138 mmol/L (ref 135–145)
Total Bilirubin: 0.5 mg/dL (ref 0.3–1.2)
Total Protein: 6.6 g/dL (ref 6.5–8.1)

## 2021-01-22 LAB — URINALYSIS, ROUTINE W REFLEX MICROSCOPIC
Bilirubin Urine: NEGATIVE
Glucose, UA: NEGATIVE mg/dL
Ketones, ur: NEGATIVE mg/dL
Leukocytes,Ua: NEGATIVE
Nitrite: NEGATIVE
Specific Gravity, Urine: 1.019 (ref 1.005–1.030)
pH: 7 (ref 5.0–8.0)

## 2021-01-22 LAB — PROTIME-INR
INR: 1 (ref 0.8–1.2)
Prothrombin Time: 13.5 seconds (ref 11.4–15.2)

## 2021-01-22 LAB — RESP PANEL BY RT-PCR (FLU A&B, COVID) ARPGX2
Influenza A by PCR: NEGATIVE
Influenza B by PCR: NEGATIVE
SARS Coronavirus 2 by RT PCR: NEGATIVE

## 2021-01-22 LAB — TROPONIN I (HIGH SENSITIVITY): Troponin I (High Sensitivity): 6 ng/L (ref <18)

## 2021-01-22 MED ORDER — LORAZEPAM 2 MG/ML IJ SOLN
1.0000 mg | Freq: Once | INTRAMUSCULAR | Status: AC
  Administered 2021-01-22: 09:00:00 1 mg via INTRAMUSCULAR
  Filled 2021-01-22: qty 1

## 2021-01-22 MED ORDER — HALOPERIDOL LACTATE 5 MG/ML IJ SOLN: 2.0000 mg | Freq: Once | INTRAMUSCULAR | Status: DC

## 2021-01-22 MED ORDER — TETANUS-DIPHTH-ACELL PERTUSSIS 5-2.5-18.5 LF-MCG/0.5 IM SUSY
0.5000 mL | PREFILLED_SYRINGE | Freq: Once | INTRAMUSCULAR | Status: AC
  Administered 2021-01-22: 09:00:00 0.5 mL via INTRAMUSCULAR
  Filled 2021-01-22: qty 0.5

## 2021-01-22 MED ORDER — HALOPERIDOL LACTATE 5 MG/ML IJ SOLN
2.0000 mg | Freq: Once | INTRAMUSCULAR | Status: AC
  Administered 2021-01-22: 09:00:00 2 mg via INTRAMUSCULAR
  Filled 2021-01-22: qty 1

## 2021-01-22 MED ORDER — IOHEXOL 300 MG/ML  SOLN
60.0000 mL | Freq: Once | INTRAMUSCULAR | Status: AC | PRN
  Administered 2021-01-22: 09:00:00 60 mL via INTRAVENOUS

## 2021-01-22 NOTE — ED Notes (Signed)
RT Note: RN made aware of updated v/s.

## 2021-01-22 NOTE — ED Provider Notes (Signed)
[facility] EMERGENCY DEPT Provider Note   CSN: [id number] Arrival date & time: 01/22/21  4585     History Chief Complaint  Patient presents with   Fall   Shoulder Pain    Jaxson Anglin is a 87 y.o. male.   Fall  Shoulder Pain  87 year old male with a medical history significant for hypothyroidism, vitamin D deficiency, HLD, dementia residing in a memory care unit, degenerative joint disease who presents to the emergency department after unwitnessed fall at his facility.  Per EMS, the patient was last seen lying in bed around 0 200 and was found laying on the floor at around 0 600.  The patient did state that he had tripped and landed on his left shoulder and struck his left eye.  He sustained an abrasion to his left eyebrow that is hemostatic.  He endorsed 10 out of 10 sharp, shooting, pain in his left shoulder.  Pain is worse with range of motion, better with rest.  No clear loss of consciousness.  The patient is demented and cannot provide further history.  Level 5 caveat due to patient dementia.  History reviewed. No pertinent past medical history.  There are no problems to display for this patient.   History reviewed. No pertinent surgical history.     History reviewed. No pertinent family history.  Social History   Tobacco Use   Smoking status: Former    Types: Cigarettes   Smokeless tobacco: Never    Home Medications Prior to Admission medications   Medication Sig Start Date End Date Taking? Authorizing Provider  benzonatate (TESSALON) 100 MG capsule Take 1 capsule (100 mg total) by mouth every 8 (eight) hours. 10/13/20   Henderly, Britni A, PA-C    Allergies    Patient has no known allergies.  Review of Systems   Review of Systems  Unable to perform ROS: Dementia   Physical Exam Updated Vital Signs BP (!) 150/76 (BP Location: Left Arm)    Pulse 71    Temp 98.3 F (36.8 C) (Oral)    Resp 18    Ht 6\' 1"  (1.854 m)    Wt 81.6 kg    SpO2  98%    BMI 23.75 kg/m   Physical Exam Vitals and nursing note reviewed.  Constitutional:      Appearance: He is well-developed.     Comments: GCS 14, ABC intact.  Pleasantly demented, redirectable  HENT:     Head: Normocephalic.  Eyes:     Conjunctiva/sclera: Conjunctivae normal.  Neck:     Comments: No midline tenderness to palpation of the cervical spine. ROM intact.  C-Collar in place Cardiovascular:     Rate and Rhythm: Normal rate and regular rhythm.     Heart sounds: No murmur heard. Pulmonary:     Effort: Pulmonary effort is normal. No respiratory distress.     Breath sounds: Normal breath sounds.  Chest:     Comments: Chest wall stable and to AP and lateral compression.  Mild chest wall tenderness to palpation bilaterally.  Clavicles stable and non-tender to AP compression Abdominal:     Palpations: Abdomen is soft.     Tenderness: There is no abdominal tenderness.     Comments: Pelvis stable to lateral compression.  Musculoskeletal:     Cervical back: Neck supple.     Comments: No midline tenderness to palpation of the thoracic or lumbar spine. Apparent packed abscess in the mid thoracic back soft tissues with dressing noted from 01/18/2021,  draining mild purulence, some surrounding erythema.  Mild tenderness to palpation of the left shoulder with some pain with range of motion.  Skin:    General: Skin is warm and dry.  Neurological:     Mental Status: He is alert.     Comments: CN II-XII grossly intact. Moving all four extremities spontaneously and sensation grossly intact.  Appears at baseline for dementia, alert and oriented to self only, GCS 14    ED Results / Procedures / Treatments   Labs (all labs ordered are listed, but only abnormal results are displayed) Labs Reviewed  URINALYSIS, ROUTINE W REFLEX MICROSCOPIC - Abnormal; Notable for the following components:      Result Value   Hgb urine dipstick SMALL (*)    Protein, ur TRACE (*)    All other  components within normal limits  RESP PANEL BY RT-PCR (FLU A&B, COVID) [id number]  COMPREHENSIVE METABOLIC PANEL  CBC  PROTIME-INR  TROPONIN I (HIGH SENSITIVITY)    EKG EKG Interpretation  Date/Time:  Sunday January 22 2021 07:52:01 EST Ventricular Rate:  52 PR Interval:  181 QRS Duration: 96 QT Interval:  455 QTC Calculation: 424 R Axis:   9 Text Interpretation: Sinus rhythm Atrial premature complexes Consider left atrial enlargement Confirmed by Ernie Avena ([id number]) on 01/22/2021 8:03:47 AM  Radiology CT HEAD WO CONTRAST  Result Date: 01/22/2021 CLINICAL DATA:  Found down EXAM: CT HEAD WITHOUT CONTRAST CT CERVICAL SPINE WITHOUT CONTRAST TECHNIQUE: Multidetector CT imaging of the head and cervical spine was performed following the standard protocol without intravenous contrast. Multiplanar CT image reconstructions of the cervical spine were also generated. COMPARISON:  08/08/2020 FINDINGS: CT HEAD FINDINGS Brain: No evidence of acute infarction, hemorrhage, hydrocephalus, extra-axial collection or mass lesion/mass effect. Mild age related atrophy. Subcortical white matter and periventricular small vessel ischemic changes. Vascular: No hyperdense vessel or unexpected calcification. Skull: Normal. Negative for fracture or focal lesion. Sinuses/Orbits: Partial opacification of the right maxillary sinus. Visualized paranasal sinuses and mastoid air cells are otherwise clear. Other: None. CT CERVICAL SPINE FINDINGS Alignment: Normal cervical lordosis. Skull base and vertebrae: No acute fracture. Old/healed type 3 dens fracture with mild residual deformity (sagittal image 26). No primary bone lesion or focal pathologic process. Soft tissues and spinal canal: No prevertebral fluid or swelling. No visible canal hematoma. Disc levels: Mild degenerative changes of the mid/lower cervical spine. Spinal canal is patent. Upper chest: Evaluated on dedicated CT chest. Other: None. IMPRESSION: No evidence of  acute intracranial abnormality. Mild age related atrophy with small vessel ischemic changes. No evidence of traumatic injury to the cervical spine. Old/healed type 3 dens fracture. Mild degenerative changes. Electronically Signed   By: Charline Bills M.D.   On: 01/22/2021 09:41   CT Chest W Contrast  Result Date: 01/22/2021 CLINICAL DATA:  Found down EXAM: CT CHEST WITH CONTRAST TECHNIQUE: Multidetector CT imaging of the chest was performed during intravenous contrast administration. CONTRAST:  52mL OMNIPAQUE IOHEXOL 300 MG/ML  SOLN COMPARISON:  None. FINDINGS: Cardiovascular: The heart is normal in size. No pericardial effusion. 4.5 cm ascending thoracic aortic aneurysm. Mild atherosclerotic calcifications of the aortic arch. Mild coronary atherosclerosis of the LAD and right coronary artery. Mediastinum/Nodes: No suspicious mediastinal lymphadenopathy. Visualized thyroid is unremarkable. Lungs/Pleura: Mild biapical pleural-parenchymal scarring. Mild centrilobular emphysematous changes, upper lobe predominant. No suspicious pulmonary nodules. No focal consolidation. No pleural effusion or pneumothorax. Upper Abdomen: Visualized upper abdomen is notable for bilateral renal cysts and hepatic cysts, benign. Vascular calcifications. Musculoskeletal: Degenerative  changes of the visualized thoracolumbar spine. No fracture is seen. IMPRESSION: No evidence of traumatic injury to the chest. 4.5 cm ascending thoracic aortic aneurysm. Recommend semi-annual imaging followup by CTA or MRA and referral to cardiothoracic surgery if not already obtained. This recommendation follows 2010 ACCF/AHA/AATS/ACR/ASA/SCA/SCAI/SIR/STS/SVM Guidelines for the Diagnosis and Management of Patients With Thoracic Aortic Disease. Circulation. 2010; 121: Z610-R604. Aortic aneurysm NOS (ICD10-I71.9) Aortic Atherosclerosis (ICD10-I70.0) and Emphysema (ICD10-J43.9). Electronically Signed   By: Charline Bills M.D.   On: 01/22/2021 09:37    CT CERVICAL SPINE WO CONTRAST  Result Date: 01/22/2021 CLINICAL DATA:  Found down EXAM: CT HEAD WITHOUT CONTRAST CT CERVICAL SPINE WITHOUT CONTRAST TECHNIQUE: Multidetector CT imaging of the head and cervical spine was performed following the standard protocol without intravenous contrast. Multiplanar CT image reconstructions of the cervical spine were also generated. COMPARISON:  08/08/2020 FINDINGS: CT HEAD FINDINGS Brain: No evidence of acute infarction, hemorrhage, hydrocephalus, extra-axial collection or mass lesion/mass effect. Mild age related atrophy. Subcortical white matter and periventricular small vessel ischemic changes. Vascular: No hyperdense vessel or unexpected calcification. Skull: Normal. Negative for fracture or focal lesion. Sinuses/Orbits: Partial opacification of the right maxillary sinus. Visualized paranasal sinuses and mastoid air cells are otherwise clear. Other: None. CT CERVICAL SPINE FINDINGS Alignment: Normal cervical lordosis. Skull base and vertebrae: No acute fracture. Old/healed type 3 dens fracture with mild residual deformity (sagittal image 26). No primary bone lesion or focal pathologic process. Soft tissues and spinal canal: No prevertebral fluid or swelling. No visible canal hematoma. Disc levels: Mild degenerative changes of the mid/lower cervical spine. Spinal canal is patent. Upper chest: Evaluated on dedicated CT chest. Other: None. IMPRESSION: No evidence of acute intracranial abnormality. Mild age related atrophy with small vessel ischemic changes. No evidence of traumatic injury to the cervical spine. Old/healed type 3 dens fracture. Mild degenerative changes. Electronically Signed   By: Charline Bills M.D.   On: 01/22/2021 09:41   DG Shoulder Left Port  Result Date: 01/22/2021 CLINICAL DATA:  Fall EXAM: LEFT SHOULDER COMPARISON:  None. FINDINGS: No fracture or dislocation is seen. Mild degenerative changes of the left shoulder. The visualized soft  tissues are unremarkable. Visualized left lung is clear. IMPRESSION: Negative. Electronically Signed   By: Charline Bills M.D.   On: 01/22/2021 09:13    Procedures Procedures   Medications Ordered in ED Medications  Tdap (BOOSTRIX) injection 0.5 mL (0.5 mLs Intramuscular Given 01/22/21 0839)  LORazepam (ATIVAN) injection 1 mg (1 mg Intramuscular Given 01/22/21 0839)  haloperidol lactate (HALDOL) injection 2 mg (2 mg Intramuscular Given 01/22/21 0839)  iohexol (OMNIPAQUE) 300 MG/ML solution 60 mL (60 mLs Intravenous Contrast Given 01/22/21 5409)    ED Course  I have reviewed the triage vital signs and the nursing notes.  Pertinent labs & imaging results that were available during my care of the patient were reviewed by me and considered in my medical decision making (see chart for details).    MDM Rules/Calculators/A&P                          87 year old male with a medical history significant for hypothyroidism, vitamin D deficiency, HLD, dementia residing in a memory care unit, degenerative joint disease who presents to the emergency department after unwitnessed fall at his facility.  Per EMS, the patient was last seen lying in bed around 0 200 and was found laying on the floor at around 0 600.  The patient did state  that he had tripped and landed on his left shoulder and struck his left eye.  He sustained an abrasion to his left eyebrow that is hemostatic.  He endorsed 10 out of 10 sharp, shooting, pain in his left shoulder.  Pain is worse with range of motion, better with rest.  No clear loss of consciousness.  The patient is demented and cannot provide further history.  On arrival, the patient was GCS 14, alert and oriented to self only, appears at baseline for his known dementia.  Patient presents with a physical exam concerning for possible musculoskeletal injury of his left shoulder, some chest wall tenderness to palpation.  Unclear syncope or LOC although this was denied to EMS.   Patient had an EKG which revealed no significant abnormal intervals, no ST segment changes to indicate ischemia, ventricular rate sinus bradycardia 52, QTc 424.  Trauma work-up initiated to include CT of the head, CT of the cervical spine, CT of the chest.  X-ray imaging of the left shoulder was also ordered.  The patient's tetanus was updated in the emergency department.  The patient did have an episode where he got out of bed and began ambulating around the exam room but was quickly redirected back to his stretcher.  He was placed in soft restraints to maintain therapeutic interventions as he had also torn out his IV.  In order to maintain safety of the patient the patient was also administered IM Ativan 1 mg and 2 mg IM Haldol.   CT imaging was performed with no evidence of intracranial abnormality, no evidence of fracture of the skull or cervical spine, no evidence of dislocation of the cervical spine, old healing dens fracture noted.  CT imaging of the chest was without evidence of rib fracture or sternal fracture.  Other acute abnormalities noted.  Patient c-collar was cleared in the emergency department.  His x-ray imaging was negative for fracture or dislocation of the left shoulder.  Overall, the patient is at his baseline mental status.  His laboratory work-up is also benign with negative COVID and flu, normal troponin, CMP unremarkable, PT/INR normal, urinalysis without evidence of UTI, CBC unremarkable.  Overall feel that he has a negative work-up at this time following his fall and he is safe to go back to his facility.  [facility] contacted for transport due to patient dementia.   Final Clinical Impression(s) / ED Diagnoses Final diagnoses:  Fall  Abrasion of forehead, initial encounter    Rx / DC Orders ED Discharge Orders     None        Ernie Avena, MD 01/22/21 1211

## 2021-01-22 NOTE — ED Notes (Signed)
RT note: This RT currently in room sitting at bedside to assure pt. safety from getting up unattended, currently at high risk of fall while awaiting transport back to his memory care facility via [facility], staff aware.

## 2021-01-22 NOTE — Discharge Instructions (Addendum)
You were evaluated in the Emergency Department and after careful evaluation, we did not find any emergent condition requiring admission or further testing in the hospital.  Your exam/testing today was overall reassuring.  Your CT imaging of the head, neck and chest did not reveal any acute traumatic injury to those areas.  You do have an old healing fracture of your dens but no acute injuries.  Your x-ray of the shoulder was negative for acute fracture or dislocation.  Please return to the Emergency Department if you experience any worsening of your condition.  Thank you for allowing [place] to be a part of your care.

## 2021-01-22 NOTE — ED Notes (Signed)
Pt found in his room moving around, IV removed by himself, leads removed. Pt reoriented and placed back on stretcher, new IV started in RAC in order to go to CT. Pt continues to ask this nurse to contact his wife. Earlier he requested that I call her between 8:30 & 9:00 because she is a late sleeper. Attempted to call her at 0850, no answer at number on file for [facility].  Pt transported to CT

## 2021-01-22 NOTE — ED Notes (Signed)
Pt discharged home after verbalizing understanding of discharge instructions; nad noted. 

## 2021-01-22 NOTE — ED Triage Notes (Signed)
°  Patient BIB [facility] after fall that occurred earlier this morning.  Facility states patient was last seen in bed around 0200, and went to check on patient around 0600 and he was laying on the floor.  Patient states he got out of bed and had mechanical fall landing on his L shoulder and hitting his L eye.  Patient has small cut on L eyebrow, bleeding controlled.  Pain 10/10, left shoulder.  No LOC.  Patient in C-collar.

## 2021-03-29 DIAGNOSIS — R4701 Aphasia: Secondary | ICD-10-CM | POA: Diagnosis not present

## 2021-03-30 DIAGNOSIS — R4701 Aphasia: Secondary | ICD-10-CM | POA: Diagnosis not present

## 2021-03-31 DIAGNOSIS — R4701 Aphasia: Secondary | ICD-10-CM | POA: Diagnosis not present

## 2021-04-03 DIAGNOSIS — R4701 Aphasia: Secondary | ICD-10-CM | POA: Diagnosis not present

## 2021-04-04 DIAGNOSIS — R4701 Aphasia: Secondary | ICD-10-CM | POA: Diagnosis not present

## 2021-04-05 DIAGNOSIS — R4701 Aphasia: Secondary | ICD-10-CM | POA: Diagnosis not present

## 2021-04-06 DIAGNOSIS — R4701 Aphasia: Secondary | ICD-10-CM | POA: Diagnosis not present

## 2021-04-10 DIAGNOSIS — R4701 Aphasia: Secondary | ICD-10-CM | POA: Diagnosis not present

## 2021-04-12 DIAGNOSIS — R4701 Aphasia: Secondary | ICD-10-CM | POA: Diagnosis not present

## 2021-04-13 DIAGNOSIS — R4701 Aphasia: Secondary | ICD-10-CM | POA: Diagnosis not present

## 2021-04-14 DIAGNOSIS — R4701 Aphasia: Secondary | ICD-10-CM | POA: Diagnosis not present

## 2021-04-17 DIAGNOSIS — R4701 Aphasia: Secondary | ICD-10-CM | POA: Diagnosis not present

## 2021-04-18 DIAGNOSIS — R4701 Aphasia: Secondary | ICD-10-CM | POA: Diagnosis not present

## 2021-04-19 DIAGNOSIS — R4701 Aphasia: Secondary | ICD-10-CM | POA: Diagnosis not present

## 2021-04-20 DIAGNOSIS — R4701 Aphasia: Secondary | ICD-10-CM | POA: Diagnosis not present

## 2021-04-21 DIAGNOSIS — R4701 Aphasia: Secondary | ICD-10-CM | POA: Diagnosis not present

## 2021-04-24 DIAGNOSIS — R4701 Aphasia: Secondary | ICD-10-CM | POA: Diagnosis not present

## 2021-04-25 DIAGNOSIS — R4701 Aphasia: Secondary | ICD-10-CM | POA: Diagnosis not present

## 2021-04-26 DIAGNOSIS — R4701 Aphasia: Secondary | ICD-10-CM | POA: Diagnosis not present

## 2021-04-27 DIAGNOSIS — R4701 Aphasia: Secondary | ICD-10-CM | POA: Diagnosis not present

## 2021-05-01 DIAGNOSIS — R4701 Aphasia: Secondary | ICD-10-CM | POA: Diagnosis not present

## 2021-05-03 DIAGNOSIS — R4701 Aphasia: Secondary | ICD-10-CM | POA: Diagnosis not present

## 2021-05-04 DIAGNOSIS — R4701 Aphasia: Secondary | ICD-10-CM | POA: Diagnosis not present

## 2021-05-08 DIAGNOSIS — R4701 Aphasia: Secondary | ICD-10-CM | POA: Diagnosis not present

## 2021-05-09 DIAGNOSIS — R4701 Aphasia: Secondary | ICD-10-CM | POA: Diagnosis not present

## 2021-05-10 DIAGNOSIS — R4701 Aphasia: Secondary | ICD-10-CM | POA: Diagnosis not present

## 2021-05-11 DIAGNOSIS — R4701 Aphasia: Secondary | ICD-10-CM | POA: Diagnosis not present

## 2021-05-15 DIAGNOSIS — R4701 Aphasia: Secondary | ICD-10-CM | POA: Diagnosis not present

## 2021-05-16 DIAGNOSIS — R4701 Aphasia: Secondary | ICD-10-CM | POA: Diagnosis not present

## 2021-05-17 DIAGNOSIS — R4701 Aphasia: Secondary | ICD-10-CM | POA: Diagnosis not present

## 2021-05-18 DIAGNOSIS — R4701 Aphasia: Secondary | ICD-10-CM | POA: Diagnosis not present

## 2021-05-22 DIAGNOSIS — R4701 Aphasia: Secondary | ICD-10-CM | POA: Diagnosis not present

## 2021-05-23 DIAGNOSIS — R4701 Aphasia: Secondary | ICD-10-CM | POA: Diagnosis not present

## 2021-05-24 DIAGNOSIS — R4701 Aphasia: Secondary | ICD-10-CM | POA: Diagnosis not present

## 2021-05-25 DIAGNOSIS — R4701 Aphasia: Secondary | ICD-10-CM | POA: Diagnosis not present

## 2021-05-26 DIAGNOSIS — R4701 Aphasia: Secondary | ICD-10-CM | POA: Diagnosis not present

## 2021-05-29 DIAGNOSIS — R4701 Aphasia: Secondary | ICD-10-CM | POA: Diagnosis not present

## 2021-05-30 DIAGNOSIS — E785 Hyperlipidemia, unspecified: Secondary | ICD-10-CM | POA: Diagnosis not present

## 2021-05-30 DIAGNOSIS — D509 Iron deficiency anemia, unspecified: Secondary | ICD-10-CM | POA: Diagnosis not present

## 2021-05-30 DIAGNOSIS — R4701 Aphasia: Secondary | ICD-10-CM | POA: Diagnosis not present

## 2021-05-30 DIAGNOSIS — E039 Hypothyroidism, unspecified: Secondary | ICD-10-CM | POA: Diagnosis not present

## 2021-05-30 DIAGNOSIS — D519 Vitamin B12 deficiency anemia, unspecified: Secondary | ICD-10-CM | POA: Diagnosis not present

## 2021-05-31 DIAGNOSIS — R4701 Aphasia: Secondary | ICD-10-CM | POA: Diagnosis not present

## 2021-06-01 DIAGNOSIS — R4701 Aphasia: Secondary | ICD-10-CM | POA: Diagnosis not present

## 2021-06-02 DIAGNOSIS — R4701 Aphasia: Secondary | ICD-10-CM | POA: Diagnosis not present

## 2021-06-05 DIAGNOSIS — R4701 Aphasia: Secondary | ICD-10-CM | POA: Diagnosis not present

## 2021-06-06 DIAGNOSIS — R4701 Aphasia: Secondary | ICD-10-CM | POA: Diagnosis not present

## 2021-06-07 DIAGNOSIS — R4701 Aphasia: Secondary | ICD-10-CM | POA: Diagnosis not present

## 2021-06-12 DIAGNOSIS — R4701 Aphasia: Secondary | ICD-10-CM | POA: Diagnosis not present

## 2021-06-13 DIAGNOSIS — R4701 Aphasia: Secondary | ICD-10-CM | POA: Diagnosis not present

## 2021-06-14 DIAGNOSIS — R4701 Aphasia: Secondary | ICD-10-CM | POA: Diagnosis not present

## 2021-06-15 DIAGNOSIS — E559 Vitamin D deficiency, unspecified: Secondary | ICD-10-CM | POA: Diagnosis not present

## 2021-06-15 DIAGNOSIS — Z9189 Other specified personal risk factors, not elsewhere classified: Secondary | ICD-10-CM | POA: Diagnosis not present

## 2021-06-15 DIAGNOSIS — E039 Hypothyroidism, unspecified: Secondary | ICD-10-CM | POA: Diagnosis not present

## 2021-06-15 DIAGNOSIS — E538 Deficiency of other specified B group vitamins: Secondary | ICD-10-CM | POA: Diagnosis not present

## 2021-06-16 DIAGNOSIS — R4701 Aphasia: Secondary | ICD-10-CM | POA: Diagnosis not present

## 2021-06-19 DIAGNOSIS — R4701 Aphasia: Secondary | ICD-10-CM | POA: Diagnosis not present

## 2021-06-20 DIAGNOSIS — R4701 Aphasia: Secondary | ICD-10-CM | POA: Diagnosis not present

## 2021-06-21 DIAGNOSIS — R4701 Aphasia: Secondary | ICD-10-CM | POA: Diagnosis not present

## 2021-06-22 DIAGNOSIS — R4701 Aphasia: Secondary | ICD-10-CM | POA: Diagnosis not present

## 2021-06-23 DIAGNOSIS — R4701 Aphasia: Secondary | ICD-10-CM | POA: Diagnosis not present

## 2021-06-26 DIAGNOSIS — R4701 Aphasia: Secondary | ICD-10-CM | POA: Diagnosis not present

## 2021-06-27 DIAGNOSIS — R4701 Aphasia: Secondary | ICD-10-CM | POA: Diagnosis not present

## 2021-06-28 DIAGNOSIS — R4701 Aphasia: Secondary | ICD-10-CM | POA: Diagnosis not present

## 2021-06-29 DIAGNOSIS — R4701 Aphasia: Secondary | ICD-10-CM | POA: Diagnosis not present

## 2021-07-03 DIAGNOSIS — R4701 Aphasia: Secondary | ICD-10-CM | POA: Diagnosis not present

## 2021-07-04 DIAGNOSIS — R4701 Aphasia: Secondary | ICD-10-CM | POA: Diagnosis not present

## 2021-07-05 DIAGNOSIS — R4701 Aphasia: Secondary | ICD-10-CM | POA: Diagnosis not present

## 2021-07-06 DIAGNOSIS — R4701 Aphasia: Secondary | ICD-10-CM | POA: Diagnosis not present

## 2021-07-07 DIAGNOSIS — R4701 Aphasia: Secondary | ICD-10-CM | POA: Diagnosis not present

## 2021-07-10 DIAGNOSIS — R4701 Aphasia: Secondary | ICD-10-CM | POA: Diagnosis not present

## 2021-07-11 DIAGNOSIS — R4701 Aphasia: Secondary | ICD-10-CM | POA: Diagnosis not present

## 2021-07-12 DIAGNOSIS — R4701 Aphasia: Secondary | ICD-10-CM | POA: Diagnosis not present

## 2021-07-13 DIAGNOSIS — R4701 Aphasia: Secondary | ICD-10-CM | POA: Diagnosis not present

## 2021-07-14 DIAGNOSIS — R4701 Aphasia: Secondary | ICD-10-CM | POA: Diagnosis not present

## 2021-07-17 DIAGNOSIS — R4701 Aphasia: Secondary | ICD-10-CM | POA: Diagnosis not present

## 2021-07-18 DIAGNOSIS — R4701 Aphasia: Secondary | ICD-10-CM | POA: Diagnosis not present

## 2021-07-19 DIAGNOSIS — R4701 Aphasia: Secondary | ICD-10-CM | POA: Diagnosis not present

## 2021-07-20 DIAGNOSIS — R4701 Aphasia: Secondary | ICD-10-CM | POA: Diagnosis not present

## 2021-07-21 DIAGNOSIS — R4701 Aphasia: Secondary | ICD-10-CM | POA: Diagnosis not present

## 2021-07-22 ENCOUNTER — Encounter (HOSPITAL_COMMUNITY): Payer: Self-pay

## 2021-07-22 ENCOUNTER — Emergency Department (HOSPITAL_COMMUNITY): Payer: Medicare Other

## 2021-07-22 ENCOUNTER — Emergency Department (HOSPITAL_COMMUNITY)
Admission: EM | Admit: 2021-07-22 | Discharge: 2021-07-22 | Disposition: A | Discharge status: Skilled Nursing Facility | Payer: Medicare Other | Attending: Emergency Medicine | Admitting: Emergency Medicine

## 2021-07-22 ENCOUNTER — Other Ambulatory Visit: Payer: Self-pay

## 2021-07-22 DIAGNOSIS — R410 Disorientation, unspecified: Secondary | ICD-10-CM | POA: Diagnosis not present

## 2021-07-22 DIAGNOSIS — E039 Hypothyroidism, unspecified: Secondary | ICD-10-CM | POA: Insufficient documentation

## 2021-07-22 DIAGNOSIS — R6889 Other general symptoms and signs: Secondary | ICD-10-CM | POA: Diagnosis not present

## 2021-07-22 DIAGNOSIS — R059 Cough, unspecified: Secondary | ICD-10-CM | POA: Diagnosis not present

## 2021-07-22 DIAGNOSIS — J209 Acute bronchitis, unspecified: Secondary | ICD-10-CM

## 2021-07-22 DIAGNOSIS — R404 Transient alteration of awareness: Secondary | ICD-10-CM | POA: Diagnosis not present

## 2021-07-22 DIAGNOSIS — F0390 Unspecified dementia without behavioral disturbance: Secondary | ICD-10-CM | POA: Insufficient documentation

## 2021-07-22 DIAGNOSIS — Z743 Need for continuous supervision: Secondary | ICD-10-CM | POA: Diagnosis not present

## 2021-07-22 DIAGNOSIS — Z79899 Other long term (current) drug therapy: Secondary | ICD-10-CM | POA: Insufficient documentation

## 2021-07-22 DIAGNOSIS — D72829 Elevated white blood cell count, unspecified: Secondary | ICD-10-CM | POA: Diagnosis not present

## 2021-07-22 DIAGNOSIS — Z87891 Personal history of nicotine dependence: Secondary | ICD-10-CM | POA: Insufficient documentation

## 2021-07-22 DIAGNOSIS — Z20822 Contact with and (suspected) exposure to covid-19: Secondary | ICD-10-CM | POA: Diagnosis not present

## 2021-07-22 DIAGNOSIS — J9811 Atelectasis: Secondary | ICD-10-CM | POA: Diagnosis not present

## 2021-07-22 HISTORY — DX: Hypothyroidism, unspecified: E03.9

## 2021-07-22 HISTORY — DX: Unspecified dementia, unspecified severity, without behavioral disturbance, psychotic disturbance, mood disturbance, and anxiety: F03.90

## 2021-07-22 LAB — CBC
HCT: 43.1 % (ref 39.0–52.0)
Hemoglobin: 13.9 g/dL (ref 13.0–17.0)
MCH: 31.7 pg (ref 26.0–34.0)
MCHC: 32.3 g/dL (ref 30.0–36.0)
MCV: 98.2 fL (ref 80.0–100.0)
Platelets: 270 10*3/uL (ref 150–400)
RBC: 4.39 MIL/uL (ref 4.22–5.81)
RDW: 13.2 % (ref 11.5–15.5)
WBC: 11.8 10*3/uL — ABNORMAL HIGH (ref 4.0–10.5)
nRBC: 0 % (ref 0.0–0.2)

## 2021-07-22 LAB — BASIC METABOLIC PANEL
Anion gap: 9 (ref 5–15)
BUN: 22 mg/dL (ref 8–23)
CO2: 27 mmol/L (ref 22–32)
Calcium: 9.6 mg/dL (ref 8.9–10.3)
Chloride: 105 mmol/L (ref 98–111)
Creatinine, Ser: 0.98 mg/dL (ref 0.61–1.24)
GFR, Estimated: >60 mL/min (ref >60)
Glucose, Bld: 98 mg/dL (ref 70–99)
Potassium: 4.2 mmol/L (ref 3.5–5.1)
Sodium: 141 mmol/L (ref 135–145)

## 2021-07-22 LAB — SARS CORONAVIRUS 2 BY RT PCR: SARS Coronavirus 2 by RT PCR: NEGATIVE

## 2021-07-22 MED ORDER — DOXYCYCLINE HYCLATE 100 MG PO TABS: 100.0000 mg | ORAL_TABLET | Freq: Two times a day (BID) | ORAL | 0 refills | Status: DC

## 2021-07-22 MED ORDER — SODIUM CHLORIDE 0.9 % IV BOLUS
500.0000 mL | Freq: Once | INTRAVENOUS | Status: AC
  Administered 2021-07-22: 500 mL via INTRAVENOUS

## 2021-07-22 MED ORDER — DOXYCYCLINE HYCLATE 100 MG PO TABS
100.0000 mg | ORAL_TABLET | Freq: Once | ORAL | Status: AC
  Administered 2021-07-22: 100 mg via ORAL
  Filled 2021-07-22: qty 1

## 2021-07-22 MED ORDER — GUAIFENESIN ER 1200 MG PO TB12: 1.0000 | ORAL_TABLET | Freq: Two times a day (BID) | ORAL | 0 refills | Status: AC

## 2021-07-22 NOTE — ED Notes (Signed)
[facility] called for transport.  

## 2021-07-22 NOTE — ED Notes (Signed)
Report called to [place] at [facility] at [place].

## 2021-07-24 DIAGNOSIS — R4701 Aphasia: Secondary | ICD-10-CM | POA: Diagnosis not present

## 2021-07-25 DIAGNOSIS — R4701 Aphasia: Secondary | ICD-10-CM | POA: Diagnosis not present

## 2021-07-26 DIAGNOSIS — R4701 Aphasia: Secondary | ICD-10-CM | POA: Diagnosis not present

## 2021-07-27 DIAGNOSIS — J40 Bronchitis, not specified as acute or chronic: Secondary | ICD-10-CM | POA: Diagnosis not present

## 2021-07-27 DIAGNOSIS — R4701 Aphasia: Secondary | ICD-10-CM | POA: Diagnosis not present

## 2021-07-27 DIAGNOSIS — E039 Hypothyroidism, unspecified: Secondary | ICD-10-CM | POA: Diagnosis not present

## 2021-07-28 DIAGNOSIS — R4701 Aphasia: Secondary | ICD-10-CM | POA: Diagnosis not present

## 2021-07-31 DIAGNOSIS — R4701 Aphasia: Secondary | ICD-10-CM | POA: Diagnosis not present

## 2021-08-02 DIAGNOSIS — R4701 Aphasia: Secondary | ICD-10-CM | POA: Diagnosis not present

## 2021-08-03 DIAGNOSIS — R4701 Aphasia: Secondary | ICD-10-CM | POA: Diagnosis not present

## 2021-08-04 DIAGNOSIS — R4701 Aphasia: Secondary | ICD-10-CM | POA: Diagnosis not present

## 2021-08-07 DIAGNOSIS — R4701 Aphasia: Secondary | ICD-10-CM | POA: Diagnosis not present

## 2021-08-08 DIAGNOSIS — R4701 Aphasia: Secondary | ICD-10-CM | POA: Diagnosis not present

## 2021-08-09 DIAGNOSIS — R4701 Aphasia: Secondary | ICD-10-CM | POA: Diagnosis not present

## 2021-08-10 DIAGNOSIS — R4701 Aphasia: Secondary | ICD-10-CM | POA: Diagnosis not present

## 2021-08-11 DIAGNOSIS — R4701 Aphasia: Secondary | ICD-10-CM | POA: Diagnosis not present

## 2021-08-14 DIAGNOSIS — R4701 Aphasia: Secondary | ICD-10-CM | POA: Diagnosis not present

## 2021-08-15 DIAGNOSIS — R4701 Aphasia: Secondary | ICD-10-CM | POA: Diagnosis not present

## 2021-08-16 DIAGNOSIS — R4701 Aphasia: Secondary | ICD-10-CM | POA: Diagnosis not present

## 2021-08-17 DIAGNOSIS — E039 Hypothyroidism, unspecified: Secondary | ICD-10-CM | POA: Diagnosis not present

## 2021-08-17 DIAGNOSIS — R4701 Aphasia: Secondary | ICD-10-CM | POA: Diagnosis not present

## 2021-08-18 DIAGNOSIS — R4701 Aphasia: Secondary | ICD-10-CM | POA: Diagnosis not present

## 2021-08-21 DIAGNOSIS — M79675 Pain in left toe(s): Secondary | ICD-10-CM | POA: Diagnosis not present

## 2021-08-21 DIAGNOSIS — R4701 Aphasia: Secondary | ICD-10-CM | POA: Diagnosis not present

## 2021-08-21 DIAGNOSIS — B351 Tinea unguium: Secondary | ICD-10-CM | POA: Diagnosis not present

## 2021-08-21 DIAGNOSIS — I739 Peripheral vascular disease, unspecified: Secondary | ICD-10-CM | POA: Diagnosis not present

## 2021-08-21 DIAGNOSIS — B353 Tinea pedis: Secondary | ICD-10-CM | POA: Diagnosis not present

## 2021-08-22 DIAGNOSIS — R4701 Aphasia: Secondary | ICD-10-CM | POA: Diagnosis not present

## 2021-08-23 DIAGNOSIS — R4701 Aphasia: Secondary | ICD-10-CM | POA: Diagnosis not present

## 2021-08-24 DIAGNOSIS — R4701 Aphasia: Secondary | ICD-10-CM | POA: Diagnosis not present

## 2021-08-25 DIAGNOSIS — R4701 Aphasia: Secondary | ICD-10-CM | POA: Diagnosis not present

## 2021-08-28 DIAGNOSIS — R4701 Aphasia: Secondary | ICD-10-CM | POA: Diagnosis not present

## 2021-08-29 DIAGNOSIS — R4701 Aphasia: Secondary | ICD-10-CM | POA: Diagnosis not present

## 2021-08-30 DIAGNOSIS — R4701 Aphasia: Secondary | ICD-10-CM | POA: Diagnosis not present

## 2021-08-31 DIAGNOSIS — R4701 Aphasia: Secondary | ICD-10-CM | POA: Diagnosis not present

## 2021-09-01 DIAGNOSIS — R4701 Aphasia: Secondary | ICD-10-CM | POA: Diagnosis not present

## 2021-09-04 DIAGNOSIS — R4701 Aphasia: Secondary | ICD-10-CM | POA: Diagnosis not present

## 2021-09-05 DIAGNOSIS — R4701 Aphasia: Secondary | ICD-10-CM | POA: Diagnosis not present

## 2021-09-06 DIAGNOSIS — R4701 Aphasia: Secondary | ICD-10-CM | POA: Diagnosis not present

## 2021-09-07 DIAGNOSIS — R4701 Aphasia: Secondary | ICD-10-CM | POA: Diagnosis not present

## 2021-09-08 DIAGNOSIS — R4701 Aphasia: Secondary | ICD-10-CM | POA: Diagnosis not present

## 2021-09-11 DIAGNOSIS — R4701 Aphasia: Secondary | ICD-10-CM | POA: Diagnosis not present

## 2021-09-12 DIAGNOSIS — R4701 Aphasia: Secondary | ICD-10-CM | POA: Diagnosis not present

## 2021-09-13 DIAGNOSIS — R4701 Aphasia: Secondary | ICD-10-CM | POA: Diagnosis not present

## 2021-09-14 DIAGNOSIS — R4701 Aphasia: Secondary | ICD-10-CM | POA: Diagnosis not present

## 2021-09-15 DIAGNOSIS — R4701 Aphasia: Secondary | ICD-10-CM | POA: Diagnosis not present

## 2021-09-18 DIAGNOSIS — R4701 Aphasia: Secondary | ICD-10-CM | POA: Diagnosis not present

## 2021-09-19 DIAGNOSIS — R4701 Aphasia: Secondary | ICD-10-CM | POA: Diagnosis not present

## 2021-09-20 DIAGNOSIS — R4701 Aphasia: Secondary | ICD-10-CM | POA: Diagnosis not present

## 2021-09-21 DIAGNOSIS — R4701 Aphasia: Secondary | ICD-10-CM | POA: Diagnosis not present

## 2021-09-22 ENCOUNTER — Encounter: Payer: Self-pay | Admitting: Family Medicine

## 2021-09-22 ENCOUNTER — Non-Acute Institutional Stay: Payer: Medicare Other | Admitting: Family Medicine

## 2021-09-22 VITALS — BP 138/70 | HR 54 | Resp 16

## 2021-09-22 DIAGNOSIS — R634 Abnormal weight loss: Secondary | ICD-10-CM

## 2021-09-22 DIAGNOSIS — Z515 Encounter for palliative care: Secondary | ICD-10-CM | POA: Diagnosis not present

## 2021-09-22 DIAGNOSIS — E039 Hypothyroidism, unspecified: Secondary | ICD-10-CM

## 2021-09-22 DIAGNOSIS — R4701 Aphasia: Secondary | ICD-10-CM | POA: Diagnosis not present

## 2021-09-22 DIAGNOSIS — F0390 Unspecified dementia without behavioral disturbance: Secondary | ICD-10-CM

## 2021-09-22 NOTE — Progress Notes (Unsigned)
Therapist, nutritional Palliative Care Consult Note Telephone: [phone]  Fax: [phone]    Date of encounter: 09/22/21 12:20 PM PATIENT NAME: Jason Valdez [address] [place] [address]   [phone] (home)  DOB: 1928-07-08 MRN: [id number] PRIMARY CARE PROVIDER:    System, Provider Not In,  No address on file None  REFERRING PROVIDER:   No referring provider defined for this encounter. N/A  RESPONSIBLE PARTY:    Contact Information     Name Relation Home Work Mobile   Valdez,Jason Spouse   [phone]   Valdez,Jason Son [phone]          I met face to face with patient in his memory care assisted living facility. Palliative Care was asked to follow this patient by consultation request of  No ref. provider found to address advance care planning and complex medical decision making. This is a follow up visit.  ASSESSMENT, SYMPTOM MANAGEMENT AND PLAN / RECOMMENDATIONS:   Dementia without behavioral disturbance Stable on [place] and [place] FAST 7 score 6a Had been receiving therapy for Jason Valdez is fluent today.   Palliative Care Encounter Has DNR and MOST on chart, uploaded to [facility]   Weight loss Loss of 24 lbs from December to June. Encourage addition of Ensure or similar protein supplement TID to help maintain weight and hydration.  Advance Care Planning/Goals of Care: Goals include to maximize quality of life and symptom management.  Exploration of goals of care in the event of a sudden injury or illness -DNR/DNI Identification of a healthcare agent-son Jason Review of advance directive documents-DNR and MOST uploaded to [facility] in [place]. Decision not to resuscitate or to de-escalate disease focused treatments due to poor prognosis. CODE STATUS: MOST as of 08/09/20: DNR/DNI with limited additional intervention Use of antibiotics and IV fluids if indicated No feeding tube.     Follow up  Palliative Care Visit: Palliative care will continue to follow for complex medical decision making, advance care planning, and clarification of goals. Return 4 weeks or prn.   This visit was coded based on medical decision making (MDM).  PPS: 60%  HOSPICE ELIGIBILITY/DIAGNOSIS: TBD  Chief Complaint:  Palliative Care is continuing to follow patient for chronic medical management in setting of dementia.  HISTORY OF PRESENT ILLNESS:  Jason Valdez is a 88 y.o. year old male with dementia and hypothyroidism.  He is seen today sitting in the common area of his ALF enjoying music with the other residents.  He attempts to take his wife in her wc back to their room but is redirected by facility staff.  Denies pain, SOB, falls, dysuria, constipation.  He states his appetite is good and "I feel fine."  Facility staff indicate a time previously when pt tried to help his wife, he dropped her. Staff reports he has a good appetite and is continent of bowel and bladder, ambulatory but does have to help and direction with bathing and dressing. He was seen in June 2023 for acute bronchitis.  History obtained from review of EMR, discussion with facility staff and Jason Valdez.      Latest Ref Rng & Units 07/22/2021   10:38 AM 01/22/2021    7:45 AM 10/13/2020    8:28 PM  CBC  WBC 4.0 - 10.5 K/uL 11.8  9.7  10.7   Hemoglobin 13.0 - 17.0 g/dL 42.5  52.5  89.4   Hematocrit 39.0 - 52.0 % 43.1  41.4  43.0   Platelets 150 -  400 K/uL 270  236  277        Latest Ref Rng & Units 07/22/2021   10:38 AM 01/22/2021    7:45 AM 10/13/2020    8:28 PM  CMP  Glucose 70 - 99 mg/dL 98  97  103   BUN 8 - 23 mg/dL $Remove'22  21  24   'QBbAmGl$ Creatinine 0.61 - 1.24 mg/dL 0.98  0.93  1.13   Sodium 135 - 145 mmol/L 141  138  138   Potassium 3.5 - 5.1 mmol/L 4.2  4.3  4.1   Chloride 98 - 111 mmol/L 105  102  103   CO2 22 - 32 mmol/L $RemoveB'27  28  27   'OfHuXTgf$ Calcium 8.9 - 10.3 mg/dL 9.6  9.1  9.1   Total Protein 6.5 - 8.1 g/dL  6.6  7.5   Total  Bilirubin 0.3 - 1.2 mg/dL  0.5  0.6   Alkaline Phos 38 - 126 U/L  50  64   AST 15 - 41 U/L  16  16   ALT 0 - 44 U/L  5  9     I reviewed available labs, medications, imaging, studies and related documents from the EMR.  Records reviewed and summarized above.   ROS General: NAD ENMT: denies dysphagia Cardiovascular: denies chest pain, denies DOE Pulmonary: denies cough, denies increased SOB Abdomen: endorses good appetite, denies constipation, endorses continence of bowel GU: denies dysuria, endorses continence of urine MSK:  denies increased weakness, no falls reported Skin: denies rashes or wounds Neurological: denies pain, denies insomnia Psych: Endorses positive mood Heme/lymph/immuno: denies bruises, abnormal bleeding  Physical Exam: Current and past weights:  Weight from facility DNP on 07/27/21 was 156 lbs, Previous weight from 01/22/21 was 180 lbs. Constitutional: NAD General: thin/WD ENMT: intact hearing, oral mucous membranes moist, dentition intact CV: S1S2, RRR, no LE edema Pulmonary: CTAB, no increased work of breathing, no cough, room air Abdomen: normo-active BS + 4 quadrants, soft and non tender GU: deferred MSK: no sarcopenia, moves all extremities, ambulatory Skin: warm and dry, no rashes or wounds on visible skin Neuro:  no generalized weakness, noted cognitive impairment Psych: non-anxious affect, pleasant, A and O x 2 Hem/lymph/immuno: no widespread bruising   Thank you for the opportunity to participate in the care of Jason Valdez.  The palliative care team will continue to follow. Please call our office at [phone] if we can be of additional assistance.   Marijo Conception, FNP -C  COVID-19 PATIENT SCREENING TOOL Asked and negative response unless otherwise noted:   Have you had symptoms of covid, tested positive or been in contact with someone with symptoms/positive test in the past 5-10 days?  no

## 2021-09-23 ENCOUNTER — Encounter: Payer: Self-pay | Admitting: Family Medicine

## 2021-09-23 DIAGNOSIS — F0390 Unspecified dementia without behavioral disturbance: Secondary | ICD-10-CM | POA: Insufficient documentation

## 2021-09-23 DIAGNOSIS — E039 Hypothyroidism, unspecified: Secondary | ICD-10-CM | POA: Insufficient documentation

## 2021-09-23 DIAGNOSIS — Z515 Encounter for palliative care: Secondary | ICD-10-CM | POA: Insufficient documentation

## 2021-09-23 DIAGNOSIS — R634 Abnormal weight loss: Secondary | ICD-10-CM | POA: Insufficient documentation

## 2021-09-25 DIAGNOSIS — R4701 Aphasia: Secondary | ICD-10-CM | POA: Diagnosis not present

## 2021-09-26 DIAGNOSIS — R4701 Aphasia: Secondary | ICD-10-CM | POA: Diagnosis not present

## 2021-09-27 DIAGNOSIS — R4701 Aphasia: Secondary | ICD-10-CM | POA: Diagnosis not present

## 2021-09-28 DIAGNOSIS — R4701 Aphasia: Secondary | ICD-10-CM | POA: Diagnosis not present

## 2021-09-29 ENCOUNTER — Other Ambulatory Visit: Payer: Self-pay

## 2021-09-29 ENCOUNTER — Emergency Department (HOSPITAL_COMMUNITY)
Admission: EM | Admit: 2021-09-29 | Discharge: 2021-09-30 | Disposition: A | Discharge status: Skilled Nursing Facility | Payer: Medicare Other | Attending: Emergency Medicine | Admitting: Emergency Medicine

## 2021-09-29 ENCOUNTER — Encounter (HOSPITAL_COMMUNITY): Payer: Self-pay

## 2021-09-29 ENCOUNTER — Emergency Department (HOSPITAL_COMMUNITY): Payer: Medicare Other

## 2021-09-29 DIAGNOSIS — S0990XA Unspecified injury of head, initial encounter: Secondary | ICD-10-CM | POA: Diagnosis not present

## 2021-09-29 DIAGNOSIS — W19XXXA Unspecified fall, initial encounter: Secondary | ICD-10-CM | POA: Diagnosis not present

## 2021-09-29 DIAGNOSIS — W1830XA Fall on same level, unspecified, initial encounter: Secondary | ICD-10-CM | POA: Insufficient documentation

## 2021-09-29 DIAGNOSIS — R4701 Aphasia: Secondary | ICD-10-CM | POA: Diagnosis not present

## 2021-09-29 DIAGNOSIS — M47812 Spondylosis without myelopathy or radiculopathy, cervical region: Secondary | ICD-10-CM | POA: Diagnosis not present

## 2021-09-29 DIAGNOSIS — Z743 Need for continuous supervision: Secondary | ICD-10-CM | POA: Diagnosis not present

## 2021-09-29 DIAGNOSIS — R519 Headache, unspecified: Secondary | ICD-10-CM | POA: Diagnosis not present

## 2021-09-29 DIAGNOSIS — F0390 Unspecified dementia without behavioral disturbance: Secondary | ICD-10-CM | POA: Diagnosis not present

## 2021-09-29 DIAGNOSIS — S0083XA Contusion of other part of head, initial encounter: Secondary | ICD-10-CM | POA: Insufficient documentation

## 2021-09-29 DIAGNOSIS — S199XXA Unspecified injury of neck, initial encounter: Secondary | ICD-10-CM | POA: Diagnosis not present

## 2021-09-29 DIAGNOSIS — M4802 Spinal stenosis, cervical region: Secondary | ICD-10-CM | POA: Diagnosis not present

## 2021-09-29 NOTE — ED Triage Notes (Addendum)
Per EMS- Patient is from [facility]. Staff reported a witnessed fall and stated that he hit his head on a lamp and the lamp broke. Patient has a hematoma to the right side of his head. EMs states-No blood thinners.

## 2021-09-29 NOTE — ED Provider Notes (Signed)
[facility]-EMERGENCY DEPT Provider Note   CSN: [id number] Arrival date & time: 09/29/21  1736     History Chief Complaint  Patient presents with   Fall    HPI Jason Valdez is a 88 y.o. male presenting for headache.  Patient states that he had a ground-level fall at his facility.  He has a history of dementia and does not remember all the details, per EMS, he tripped over a lamp and hit the side of his left forehead on a piece of the lamp.  He denies fevers or chills nausea vomiting syncope shortness of breath.  Patient is able to ambulate at this time.  He has no back pain, he does have a slight headache..   Patient's recorded medical, surgical, social, medication list and allergies were reviewed in the Snapshot window as part of the initial history.   Review of Systems   Review of Systems  Unable to perform ROS: Dementia    Physical Exam Updated Vital Signs SpO2 98%  Physical Exam Vitals and nursing note reviewed.  Constitutional:      General: He is not in acute distress.    Appearance: He is well-developed.  HENT:     Head: Normocephalic and atraumatic.  Eyes:     Conjunctiva/sclera: Conjunctivae normal.  Cardiovascular:     Rate and Rhythm: Normal rate and regular rhythm.     Heart sounds: No murmur heard. Pulmonary:     Effort: Pulmonary effort is normal. No respiratory distress.     Breath sounds: Normal breath sounds.  Abdominal:     Palpations: Abdomen is soft.     Tenderness: There is no abdominal tenderness.  Musculoskeletal:        General: Signs of injury (2 cm hematoma to patient's left forehead.  0.5 cm abrasion to patient's top of forehead.) present. No swelling.     Cervical back: Neck supple.  Skin:    General: Skin is warm and dry.     Capillary Refill: Capillary refill takes less than 2 seconds.  Neurological:     Mental Status: He is alert.  Psychiatric:        Mood and Affect: Mood normal.      ED Course/ Medical  Decision Making/ A&P    Procedures Procedures   Medications Ordered in ED Medications - No data to display Medical Decision Making:    Jason Valdez is a 88 y.o. male who presented to the ED today with a moderate mechanisma trauma, detailed above.    Handoff received from EMS.  Patient's presentation is complicated by their history of multiple comorbid medical problems including dementia requiring skilled nursing care.  Patient placed on continuous vitals and telemetry monitoring while in ED which was reviewed periodically.   Given this mechanism of trauma, a full physical exam was performed. Notably, patient was hemodynamically stable in no acute distress, mild injuries detailed above.   Reviewed and confirmed nursing documentation for past medical history, family history, social history.    Initial Assessment/Plan:   This is a patient presenting with a moderate mechanism trauma.  As such, I have considered intracranial injuries including intracranial hemorrhage, intrathoracic injuries including blunt myocardial or blunt lung injury, blunt abdominal injuries including aortic dissection, bladder injury, spleen injury, liver injury and I have considered orthopedic injuries including extremity or spinal injury.  With the patient's presentation of moderate mechanism trauma but an otherwise reassuring exam, patient warrants targeted evaluation for potential traumatic injuries. Will proceed with  targeted evaluation for potential injuries. Will proceed with CT head, CT C-spine. Objective evaluation resulted with no acute abnormalities.   Final Reassessment and Plan:   On repeat evaluation, patient is symptomatically stable.  Given otherwise reassuring objective evaluation and current clinical status, patient stable for outpatient care and management with plan for reassessment by primary care provider within 48 hours.  Transportation arranged to return patient to skilled nursing  environment. Attempted to update patient's legal guardian and son.  Unfortunately, no answer at listed phone number.  However patient is returning to safe environment with skilled nursing care and is stable for outpatient care and management.  Clinical Impression:  1. Fall, initial encounter      Discharge   Final Clinical Impression(s) / ED Diagnoses Final diagnoses:  Fall, initial encounter    Rx / DC Orders ED Discharge Orders     None         Glyn Ade, MD 09/29/21 1919

## 2021-09-29 NOTE — ED Notes (Signed)
Transportation arranged with [facility].  

## 2021-09-30 NOTE — ED Notes (Signed)
Pt is sleeping. Will assess vitals once he is awake.

## 2021-10-02 DIAGNOSIS — R4701 Aphasia: Secondary | ICD-10-CM | POA: Diagnosis not present

## 2021-10-03 DIAGNOSIS — R4701 Aphasia: Secondary | ICD-10-CM | POA: Diagnosis not present

## 2021-10-04 DIAGNOSIS — R4701 Aphasia: Secondary | ICD-10-CM | POA: Diagnosis not present

## 2021-10-05 DIAGNOSIS — R4701 Aphasia: Secondary | ICD-10-CM | POA: Diagnosis not present

## 2021-10-09 DIAGNOSIS — R4701 Aphasia: Secondary | ICD-10-CM | POA: Diagnosis not present

## 2021-10-10 DIAGNOSIS — R4701 Aphasia: Secondary | ICD-10-CM | POA: Diagnosis not present

## 2021-10-11 DIAGNOSIS — R4701 Aphasia: Secondary | ICD-10-CM | POA: Diagnosis not present

## 2021-10-16 DIAGNOSIS — R4701 Aphasia: Secondary | ICD-10-CM | POA: Diagnosis not present

## 2021-10-18 DIAGNOSIS — R4701 Aphasia: Secondary | ICD-10-CM | POA: Diagnosis not present

## 2021-10-19 DIAGNOSIS — R4701 Aphasia: Secondary | ICD-10-CM | POA: Diagnosis not present

## 2021-10-23 DIAGNOSIS — R4701 Aphasia: Secondary | ICD-10-CM | POA: Diagnosis not present

## 2021-10-24 DIAGNOSIS — R4701 Aphasia: Secondary | ICD-10-CM | POA: Diagnosis not present

## 2021-10-25 DIAGNOSIS — R4701 Aphasia: Secondary | ICD-10-CM | POA: Diagnosis not present

## 2021-10-30 DIAGNOSIS — R4701 Aphasia: Secondary | ICD-10-CM | POA: Diagnosis not present

## 2021-11-01 DIAGNOSIS — R4701 Aphasia: Secondary | ICD-10-CM | POA: Diagnosis not present

## 2021-11-03 DIAGNOSIS — R4701 Aphasia: Secondary | ICD-10-CM | POA: Diagnosis not present

## 2021-11-06 DIAGNOSIS — R4701 Aphasia: Secondary | ICD-10-CM | POA: Diagnosis not present

## 2021-11-07 DIAGNOSIS — R4701 Aphasia: Secondary | ICD-10-CM | POA: Diagnosis not present

## 2021-11-09 DIAGNOSIS — R4701 Aphasia: Secondary | ICD-10-CM | POA: Diagnosis not present

## 2021-11-10 DIAGNOSIS — R4701 Aphasia: Secondary | ICD-10-CM | POA: Diagnosis not present

## 2021-11-13 DIAGNOSIS — R4701 Aphasia: Secondary | ICD-10-CM | POA: Diagnosis not present

## 2021-11-17 DIAGNOSIS — E782 Mixed hyperlipidemia: Secondary | ICD-10-CM | POA: Diagnosis not present

## 2021-11-17 DIAGNOSIS — F02B0 Dementia in other diseases classified elsewhere, moderate, without behavioral disturbance, psychotic disturbance, mood disturbance, and anxiety: Secondary | ICD-10-CM | POA: Diagnosis not present

## 2021-11-17 DIAGNOSIS — M8949 Other hypertrophic osteoarthropathy, multiple sites: Secondary | ICD-10-CM | POA: Diagnosis not present

## 2021-11-17 DIAGNOSIS — G301 Alzheimer's disease with late onset: Secondary | ICD-10-CM | POA: Diagnosis not present

## 2021-11-17 DIAGNOSIS — E039 Hypothyroidism, unspecified: Secondary | ICD-10-CM | POA: Diagnosis not present

## 2021-11-22 DIAGNOSIS — M8949 Other hypertrophic osteoarthropathy, multiple sites: Secondary | ICD-10-CM | POA: Diagnosis not present

## 2021-11-22 DIAGNOSIS — G301 Alzheimer's disease with late onset: Secondary | ICD-10-CM | POA: Diagnosis not present

## 2021-11-22 DIAGNOSIS — E039 Hypothyroidism, unspecified: Secondary | ICD-10-CM | POA: Diagnosis not present

## 2021-11-23 DIAGNOSIS — M8949 Other hypertrophic osteoarthropathy, multiple sites: Secondary | ICD-10-CM | POA: Diagnosis not present

## 2021-11-23 DIAGNOSIS — M25512 Pain in left shoulder: Secondary | ICD-10-CM | POA: Diagnosis not present

## 2021-11-23 DIAGNOSIS — M6281 Muscle weakness (generalized): Secondary | ICD-10-CM | POA: Diagnosis not present

## 2021-11-23 DIAGNOSIS — M25511 Pain in right shoulder: Secondary | ICD-10-CM | POA: Diagnosis not present

## 2021-11-23 DIAGNOSIS — E039 Hypothyroidism, unspecified: Secondary | ICD-10-CM | POA: Diagnosis not present

## 2021-11-23 DIAGNOSIS — M542 Cervicalgia: Secondary | ICD-10-CM | POA: Diagnosis not present

## 2021-11-23 DIAGNOSIS — R279 Unspecified lack of coordination: Secondary | ICD-10-CM | POA: Diagnosis not present

## 2021-11-27 DIAGNOSIS — M6281 Muscle weakness (generalized): Secondary | ICD-10-CM | POA: Diagnosis not present

## 2021-11-27 DIAGNOSIS — E039 Hypothyroidism, unspecified: Secondary | ICD-10-CM | POA: Diagnosis not present

## 2021-11-27 DIAGNOSIS — M542 Cervicalgia: Secondary | ICD-10-CM | POA: Diagnosis not present

## 2021-11-27 DIAGNOSIS — M25512 Pain in left shoulder: Secondary | ICD-10-CM | POA: Diagnosis not present

## 2021-11-27 DIAGNOSIS — M25511 Pain in right shoulder: Secondary | ICD-10-CM | POA: Diagnosis not present

## 2021-11-27 DIAGNOSIS — M8949 Other hypertrophic osteoarthropathy, multiple sites: Secondary | ICD-10-CM | POA: Diagnosis not present

## 2021-11-27 DIAGNOSIS — R279 Unspecified lack of coordination: Secondary | ICD-10-CM | POA: Diagnosis not present

## 2021-11-30 DIAGNOSIS — R279 Unspecified lack of coordination: Secondary | ICD-10-CM | POA: Diagnosis not present

## 2021-11-30 DIAGNOSIS — E039 Hypothyroidism, unspecified: Secondary | ICD-10-CM | POA: Diagnosis not present

## 2021-11-30 DIAGNOSIS — M25511 Pain in right shoulder: Secondary | ICD-10-CM | POA: Diagnosis not present

## 2021-11-30 DIAGNOSIS — M25512 Pain in left shoulder: Secondary | ICD-10-CM | POA: Diagnosis not present

## 2021-11-30 DIAGNOSIS — R293 Abnormal posture: Secondary | ICD-10-CM | POA: Diagnosis not present

## 2021-11-30 DIAGNOSIS — M8949 Other hypertrophic osteoarthropathy, multiple sites: Secondary | ICD-10-CM | POA: Diagnosis not present

## 2021-11-30 DIAGNOSIS — F03918 Unspecified dementia, unspecified severity, with other behavioral disturbance: Secondary | ICD-10-CM | POA: Diagnosis not present

## 2021-11-30 DIAGNOSIS — M6281 Muscle weakness (generalized): Secondary | ICD-10-CM | POA: Diagnosis not present

## 2021-11-30 DIAGNOSIS — R2681 Unsteadiness on feet: Secondary | ICD-10-CM | POA: Diagnosis not present

## 2021-11-30 DIAGNOSIS — M542 Cervicalgia: Secondary | ICD-10-CM | POA: Diagnosis not present

## 2021-12-04 DIAGNOSIS — M25512 Pain in left shoulder: Secondary | ICD-10-CM | POA: Diagnosis not present

## 2021-12-04 DIAGNOSIS — R279 Unspecified lack of coordination: Secondary | ICD-10-CM | POA: Diagnosis not present

## 2021-12-04 DIAGNOSIS — M25511 Pain in right shoulder: Secondary | ICD-10-CM | POA: Diagnosis not present

## 2021-12-04 DIAGNOSIS — R293 Abnormal posture: Secondary | ICD-10-CM | POA: Diagnosis not present

## 2021-12-04 DIAGNOSIS — E039 Hypothyroidism, unspecified: Secondary | ICD-10-CM | POA: Diagnosis not present

## 2021-12-04 DIAGNOSIS — R2681 Unsteadiness on feet: Secondary | ICD-10-CM | POA: Diagnosis not present

## 2021-12-04 DIAGNOSIS — M8949 Other hypertrophic osteoarthropathy, multiple sites: Secondary | ICD-10-CM | POA: Diagnosis not present

## 2021-12-04 DIAGNOSIS — F03918 Unspecified dementia, unspecified severity, with other behavioral disturbance: Secondary | ICD-10-CM | POA: Diagnosis not present

## 2021-12-04 DIAGNOSIS — M542 Cervicalgia: Secondary | ICD-10-CM | POA: Diagnosis not present

## 2021-12-04 DIAGNOSIS — M6281 Muscle weakness (generalized): Secondary | ICD-10-CM | POA: Diagnosis not present

## 2021-12-15 DIAGNOSIS — E782 Mixed hyperlipidemia: Secondary | ICD-10-CM | POA: Diagnosis not present

## 2021-12-15 DIAGNOSIS — F02B3 Dementia in other diseases classified elsewhere, moderate, with mood disturbance: Secondary | ICD-10-CM | POA: Diagnosis not present

## 2021-12-15 DIAGNOSIS — M8949 Other hypertrophic osteoarthropathy, multiple sites: Secondary | ICD-10-CM | POA: Diagnosis not present

## 2021-12-15 DIAGNOSIS — G301 Alzheimer's disease with late onset: Secondary | ICD-10-CM | POA: Diagnosis not present

## 2021-12-15 DIAGNOSIS — E039 Hypothyroidism, unspecified: Secondary | ICD-10-CM | POA: Diagnosis not present

## 2021-12-15 DIAGNOSIS — G479 Sleep disorder, unspecified: Secondary | ICD-10-CM | POA: Diagnosis not present

## 2021-12-18 DIAGNOSIS — D508 Other iron deficiency anemias: Secondary | ICD-10-CM | POA: Diagnosis not present

## 2021-12-18 DIAGNOSIS — R531 Weakness: Secondary | ICD-10-CM | POA: Diagnosis not present

## 2021-12-18 DIAGNOSIS — M6281 Muscle weakness (generalized): Secondary | ICD-10-CM | POA: Diagnosis not present

## 2022-01-02 DIAGNOSIS — R2681 Unsteadiness on feet: Secondary | ICD-10-CM | POA: Diagnosis not present

## 2022-01-02 DIAGNOSIS — M25511 Pain in right shoulder: Secondary | ICD-10-CM | POA: Diagnosis not present

## 2022-01-02 DIAGNOSIS — F03918 Unspecified dementia, unspecified severity, with other behavioral disturbance: Secondary | ICD-10-CM | POA: Diagnosis not present

## 2022-01-02 DIAGNOSIS — M25512 Pain in left shoulder: Secondary | ICD-10-CM | POA: Diagnosis not present

## 2022-01-02 DIAGNOSIS — R293 Abnormal posture: Secondary | ICD-10-CM | POA: Diagnosis not present

## 2022-01-02 DIAGNOSIS — M542 Cervicalgia: Secondary | ICD-10-CM | POA: Diagnosis not present

## 2022-01-04 DIAGNOSIS — M542 Cervicalgia: Secondary | ICD-10-CM | POA: Diagnosis not present

## 2022-01-04 DIAGNOSIS — R2681 Unsteadiness on feet: Secondary | ICD-10-CM | POA: Diagnosis not present

## 2022-01-04 DIAGNOSIS — M25512 Pain in left shoulder: Secondary | ICD-10-CM | POA: Diagnosis not present

## 2022-01-04 DIAGNOSIS — R293 Abnormal posture: Secondary | ICD-10-CM | POA: Diagnosis not present

## 2022-01-04 DIAGNOSIS — F03918 Unspecified dementia, unspecified severity, with other behavioral disturbance: Secondary | ICD-10-CM | POA: Diagnosis not present

## 2022-01-04 DIAGNOSIS — M25511 Pain in right shoulder: Secondary | ICD-10-CM | POA: Diagnosis not present

## 2022-01-08 DIAGNOSIS — R293 Abnormal posture: Secondary | ICD-10-CM | POA: Diagnosis not present

## 2022-01-08 DIAGNOSIS — M25512 Pain in left shoulder: Secondary | ICD-10-CM | POA: Diagnosis not present

## 2022-01-08 DIAGNOSIS — R2681 Unsteadiness on feet: Secondary | ICD-10-CM | POA: Diagnosis not present

## 2022-01-08 DIAGNOSIS — M542 Cervicalgia: Secondary | ICD-10-CM | POA: Diagnosis not present

## 2022-01-08 DIAGNOSIS — F03918 Unspecified dementia, unspecified severity, with other behavioral disturbance: Secondary | ICD-10-CM | POA: Diagnosis not present

## 2022-01-08 DIAGNOSIS — M25511 Pain in right shoulder: Secondary | ICD-10-CM | POA: Diagnosis not present

## 2022-01-10 DIAGNOSIS — M25512 Pain in left shoulder: Secondary | ICD-10-CM | POA: Diagnosis not present

## 2022-01-10 DIAGNOSIS — M25511 Pain in right shoulder: Secondary | ICD-10-CM | POA: Diagnosis not present

## 2022-01-10 DIAGNOSIS — F03918 Unspecified dementia, unspecified severity, with other behavioral disturbance: Secondary | ICD-10-CM | POA: Diagnosis not present

## 2022-01-10 DIAGNOSIS — M542 Cervicalgia: Secondary | ICD-10-CM | POA: Diagnosis not present

## 2022-01-10 DIAGNOSIS — R293 Abnormal posture: Secondary | ICD-10-CM | POA: Diagnosis not present

## 2022-01-10 DIAGNOSIS — R2681 Unsteadiness on feet: Secondary | ICD-10-CM | POA: Diagnosis not present

## 2022-01-12 DIAGNOSIS — E039 Hypothyroidism, unspecified: Secondary | ICD-10-CM | POA: Diagnosis not present

## 2022-01-12 DIAGNOSIS — F02B0 Dementia in other diseases classified elsewhere, moderate, without behavioral disturbance, psychotic disturbance, mood disturbance, and anxiety: Secondary | ICD-10-CM | POA: Diagnosis not present

## 2022-01-12 DIAGNOSIS — M8949 Other hypertrophic osteoarthropathy, multiple sites: Secondary | ICD-10-CM | POA: Diagnosis not present

## 2022-01-12 DIAGNOSIS — G301 Alzheimer's disease with late onset: Secondary | ICD-10-CM | POA: Diagnosis not present

## 2022-01-15 DIAGNOSIS — R293 Abnormal posture: Secondary | ICD-10-CM | POA: Diagnosis not present

## 2022-01-15 DIAGNOSIS — M542 Cervicalgia: Secondary | ICD-10-CM | POA: Diagnosis not present

## 2022-01-15 DIAGNOSIS — M25511 Pain in right shoulder: Secondary | ICD-10-CM | POA: Diagnosis not present

## 2022-01-15 DIAGNOSIS — R2681 Unsteadiness on feet: Secondary | ICD-10-CM | POA: Diagnosis not present

## 2022-01-15 DIAGNOSIS — M25512 Pain in left shoulder: Secondary | ICD-10-CM | POA: Diagnosis not present

## 2022-01-15 DIAGNOSIS — F03918 Unspecified dementia, unspecified severity, with other behavioral disturbance: Secondary | ICD-10-CM | POA: Diagnosis not present

## 2022-01-17 DIAGNOSIS — M542 Cervicalgia: Secondary | ICD-10-CM | POA: Diagnosis not present

## 2022-01-17 DIAGNOSIS — M25511 Pain in right shoulder: Secondary | ICD-10-CM | POA: Diagnosis not present

## 2022-01-17 DIAGNOSIS — R293 Abnormal posture: Secondary | ICD-10-CM | POA: Diagnosis not present

## 2022-01-17 DIAGNOSIS — R2681 Unsteadiness on feet: Secondary | ICD-10-CM | POA: Diagnosis not present

## 2022-01-17 DIAGNOSIS — F03918 Unspecified dementia, unspecified severity, with other behavioral disturbance: Secondary | ICD-10-CM | POA: Diagnosis not present

## 2022-01-17 DIAGNOSIS — M25512 Pain in left shoulder: Secondary | ICD-10-CM | POA: Diagnosis not present

## 2022-01-18 DIAGNOSIS — L84 Corns and callosities: Secondary | ICD-10-CM | POA: Diagnosis not present

## 2022-01-18 DIAGNOSIS — L603 Nail dystrophy: Secondary | ICD-10-CM | POA: Diagnosis not present

## 2022-01-18 DIAGNOSIS — B351 Tinea unguium: Secondary | ICD-10-CM | POA: Diagnosis not present

## 2022-01-18 DIAGNOSIS — L600 Ingrowing nail: Secondary | ICD-10-CM | POA: Diagnosis not present

## 2022-08-16 IMAGING — DX DG SHOULDER 2+V*R*
3 series · 3 of 3 positions shown · non-contrast
Comparison: None.

CLINICAL DATA: Pain after a fall.

EXAM:
RIGHT SHOULDER - 2+ VIEW

[shoulder ap]
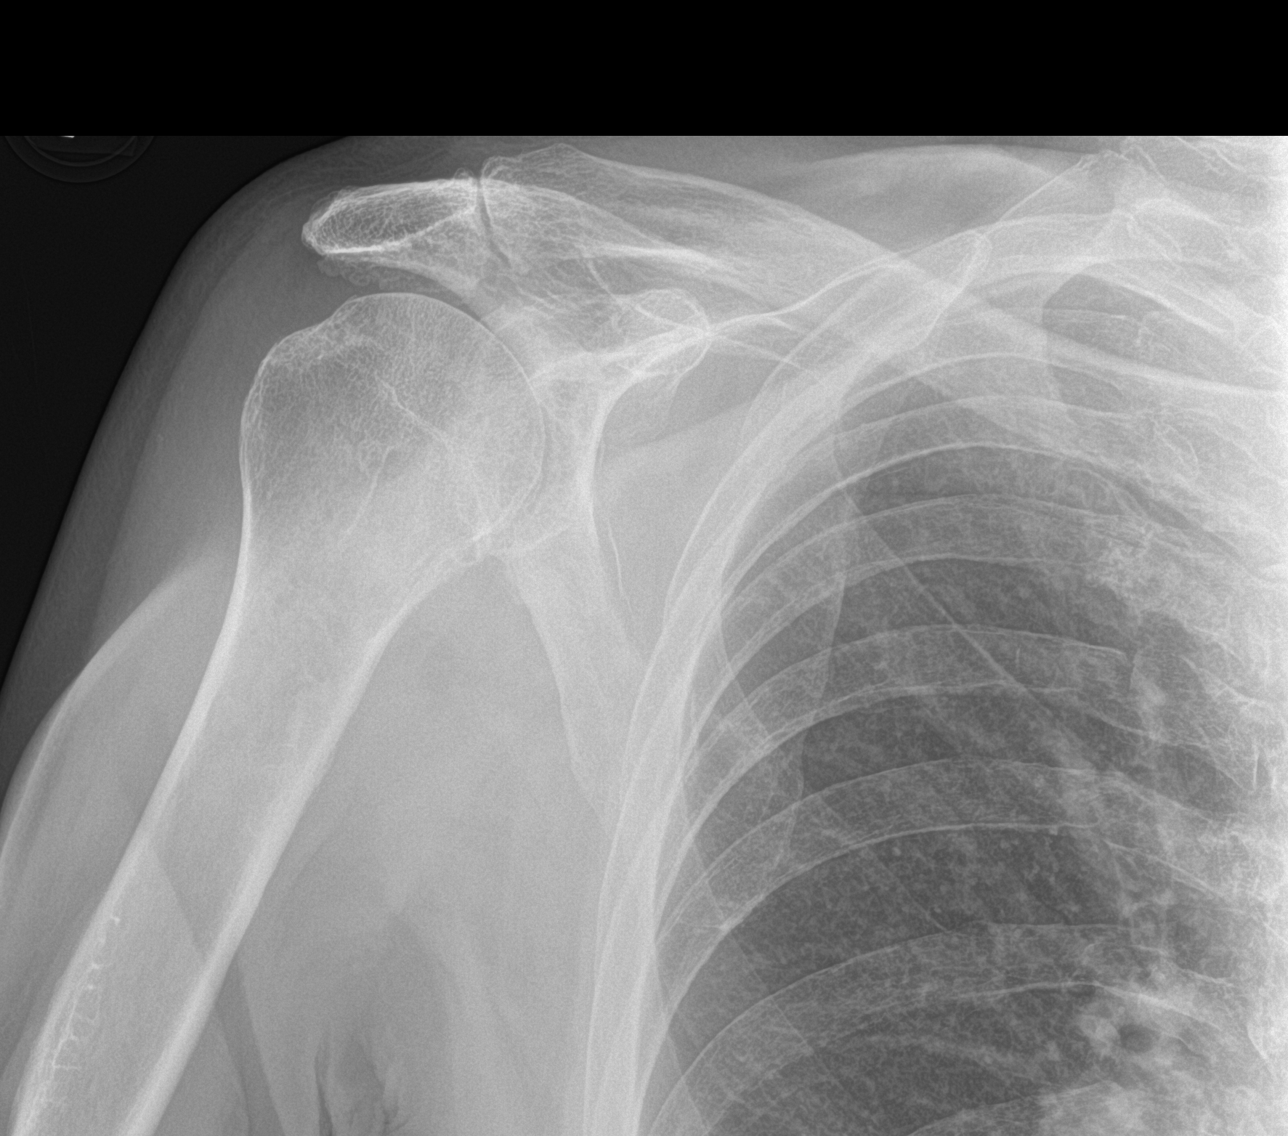

[shoulder obl (1 of 2)]
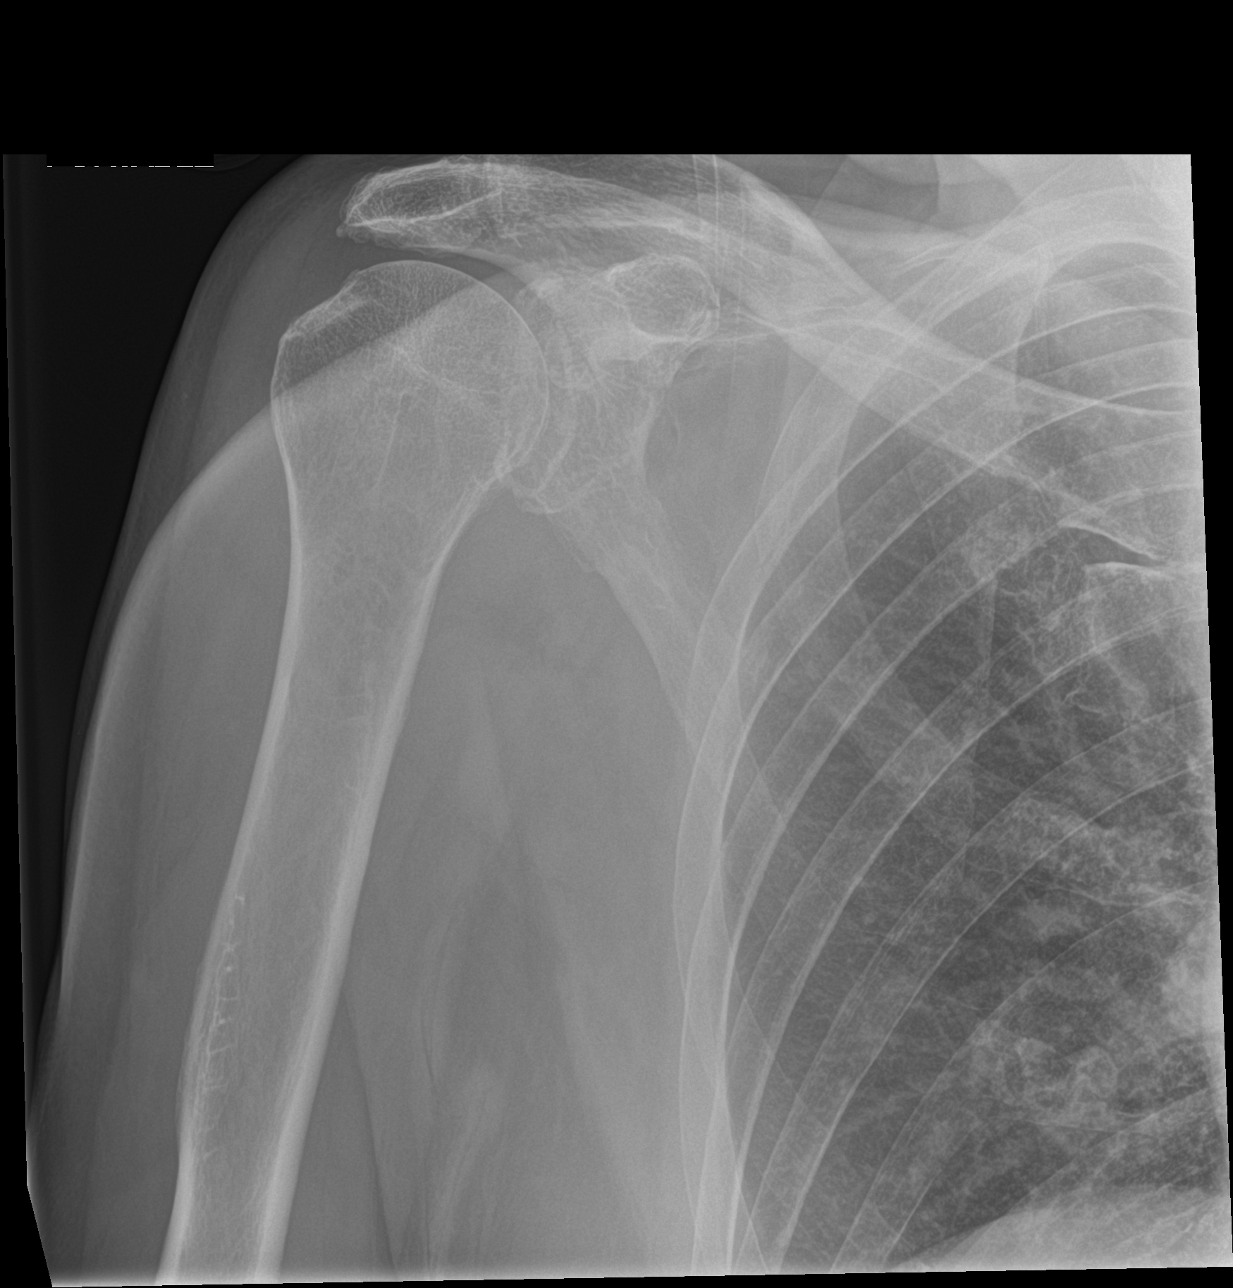

[shoulder obl (2 of 2)]
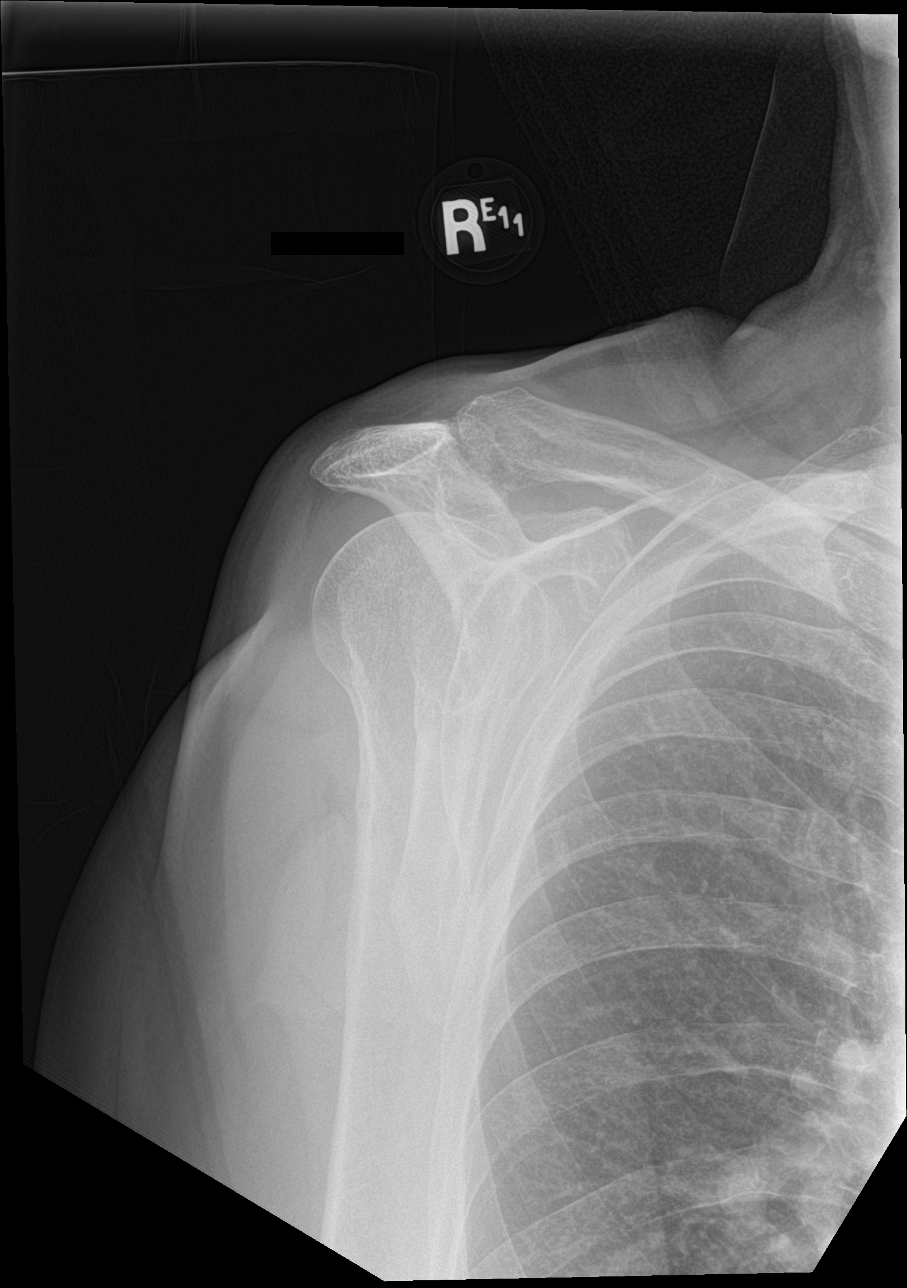

[3 of 3 positions shown; findings below may reference images not displayed]

FINDINGS: No evidence for an acute fracture. No subluxation or dislocation. No
shoulder separation. Degenerative changes noted at the
acromioclavicular joint.
IMPRESSION: Negative.

## 2022-09-30 DEATH — deceased
